# Patient Record
Sex: Female | Born: 1980 | Race: White | Hispanic: No | Marital: Single | Smoking: Current every day smoker
Health system: Southern US, Community
[De-identification: ages and names within clinical notes are randomized; demographics above are authoritative.]

## PROBLEM LIST (undated history)

## (undated) DIAGNOSIS — F4310 Post-traumatic stress disorder, unspecified: Secondary | ICD-10-CM

## (undated) DIAGNOSIS — R51 Headache: Secondary | ICD-10-CM

## (undated) DIAGNOSIS — N159 Renal tubulo-interstitial disease, unspecified: Secondary | ICD-10-CM

## (undated) DIAGNOSIS — F419 Anxiety disorder, unspecified: Secondary | ICD-10-CM

## (undated) DIAGNOSIS — F988 Other specified behavioral and emotional disorders with onset usually occurring in childhood and adolescence: Secondary | ICD-10-CM

## (undated) DIAGNOSIS — D649 Anemia, unspecified: Secondary | ICD-10-CM

## (undated) DIAGNOSIS — F329 Major depressive disorder, single episode, unspecified: Secondary | ICD-10-CM

## (undated) DIAGNOSIS — M797 Fibromyalgia: Secondary | ICD-10-CM

## (undated) DIAGNOSIS — R519 Headache, unspecified: Secondary | ICD-10-CM

## (undated) DIAGNOSIS — F319 Bipolar disorder, unspecified: Secondary | ICD-10-CM

## (undated) DIAGNOSIS — I499 Cardiac arrhythmia, unspecified: Secondary | ICD-10-CM

## (undated) DIAGNOSIS — N390 Urinary tract infection, site not specified: Secondary | ICD-10-CM

## (undated) DIAGNOSIS — IMO0001 Reserved for inherently not codable concepts without codable children: Secondary | ICD-10-CM

## (undated) DIAGNOSIS — I10 Essential (primary) hypertension: Secondary | ICD-10-CM

## (undated) DIAGNOSIS — Z8489 Family history of other specified conditions: Secondary | ICD-10-CM

## (undated) DIAGNOSIS — F32A Depression, unspecified: Secondary | ICD-10-CM

## (undated) HISTORY — PX: BREAST SURGERY: SHX581

## (undated) HISTORY — PX: WISDOM TOOTH EXTRACTION: SHX21

## (undated) HISTORY — PX: URETHRA SURGERY: SHX824

---

## 2012-12-11 ENCOUNTER — Encounter (HOSPITAL_BASED_OUTPATIENT_CLINIC_OR_DEPARTMENT_OTHER): Payer: Self-pay

## 2012-12-11 ENCOUNTER — Emergency Department (HOSPITAL_BASED_OUTPATIENT_CLINIC_OR_DEPARTMENT_OTHER)
Admission: EM | Admit: 2012-12-11 | Discharge: 2012-12-11 | Disposition: A | Discharge status: Home / Self Care | Payer: Self-pay | Attending: Emergency Medicine | Admitting: Emergency Medicine

## 2012-12-11 ENCOUNTER — Emergency Department (HOSPITAL_BASED_OUTPATIENT_CLINIC_OR_DEPARTMENT_OTHER): Payer: Self-pay

## 2012-12-11 DIAGNOSIS — IMO0002 Reserved for concepts with insufficient information to code with codable children: Secondary | ICD-10-CM | POA: Insufficient documentation

## 2012-12-11 DIAGNOSIS — Y929 Unspecified place or not applicable: Secondary | ICD-10-CM | POA: Insufficient documentation

## 2012-12-11 DIAGNOSIS — Z87448 Personal history of other diseases of urinary system: Secondary | ICD-10-CM | POA: Insufficient documentation

## 2012-12-11 DIAGNOSIS — S46909A Unspecified injury of unspecified muscle, fascia and tendon at shoulder and upper arm level, unspecified arm, initial encounter: Secondary | ICD-10-CM | POA: Insufficient documentation

## 2012-12-11 DIAGNOSIS — M79603 Pain in arm, unspecified: Secondary | ICD-10-CM

## 2012-12-11 DIAGNOSIS — S4980XA Other specified injuries of shoulder and upper arm, unspecified arm, initial encounter: Secondary | ICD-10-CM | POA: Insufficient documentation

## 2012-12-11 DIAGNOSIS — F988 Other specified behavioral and emotional disorders with onset usually occurring in childhood and adolescence: Secondary | ICD-10-CM | POA: Insufficient documentation

## 2012-12-11 DIAGNOSIS — Z87440 Personal history of urinary (tract) infections: Secondary | ICD-10-CM | POA: Insufficient documentation

## 2012-12-11 DIAGNOSIS — M549 Dorsalgia, unspecified: Secondary | ICD-10-CM

## 2012-12-11 DIAGNOSIS — Z79899 Other long term (current) drug therapy: Secondary | ICD-10-CM | POA: Insufficient documentation

## 2012-12-11 DIAGNOSIS — Z3202 Encounter for pregnancy test, result negative: Secondary | ICD-10-CM | POA: Insufficient documentation

## 2012-12-11 DIAGNOSIS — F17200 Nicotine dependence, unspecified, uncomplicated: Secondary | ICD-10-CM | POA: Insufficient documentation

## 2012-12-11 DIAGNOSIS — R296 Repeated falls: Secondary | ICD-10-CM | POA: Insufficient documentation

## 2012-12-11 DIAGNOSIS — Y939 Activity, unspecified: Secondary | ICD-10-CM | POA: Insufficient documentation

## 2012-12-11 HISTORY — DX: Other specified behavioral and emotional disorders with onset usually occurring in childhood and adolescence: F98.8

## 2012-12-11 HISTORY — DX: Urinary tract infection, site not specified: N39.0

## 2012-12-11 HISTORY — DX: Renal tubulo-interstitial disease, unspecified: N15.9

## 2012-12-11 LAB — URINALYSIS, ROUTINE W REFLEX MICROSCOPIC
Bilirubin Urine: NEGATIVE
Ketones, ur: NEGATIVE mg/dL
Leukocytes, UA: NEGATIVE
Nitrite: NEGATIVE
Protein, ur: NEGATIVE mg/dL
Urobilinogen, UA: 1 mg/dL (ref 0.0–1.0)
pH: 6 (ref 5.0–8.0)

## 2012-12-11 MED ORDER — OXYCODONE-ACETAMINOPHEN 5-325 MG PO TABS
1.0000 | ORAL_TABLET | Freq: Once | ORAL | Status: AC
  Administered 2012-12-11: 1 via ORAL
  Filled 2012-12-11 (×2): qty 1

## 2012-12-11 MED ORDER — OXYCODONE-ACETAMINOPHEN 5-325 MG PO TABS: 1.0000 | ORAL_TABLET | ORAL | Status: DC | PRN

## 2012-12-11 MED ORDER — CYCLOBENZAPRINE HCL 5 MG PO TABS: 5.0000 mg | ORAL_TABLET | Freq: Two times a day (BID) | ORAL | Status: DC | PRN

## 2012-12-11 MED ORDER — CYCLOBENZAPRINE HCL 10 MG PO TABS
5.0000 mg | ORAL_TABLET | Freq: Once | ORAL | Status: AC
  Administered 2012-12-11: 5 mg via ORAL
  Filled 2012-12-11: qty 1

## 2012-12-11 NOTE — ED Provider Notes (Signed)
Medical screening examination/treatment/procedure(s) were performed by non-physician practitioner and as supervising physician I was immediately available for consultation/collaboration.   Dione Booze, MD 12/11/12 [phone]

## 2012-12-11 NOTE — ED Notes (Signed)
Patient transported to X-ray 

## 2012-12-11 NOTE — ED Notes (Signed)
C/o lower back pain x 2 weeks- pain to right arm x 1 week

## 2012-12-11 NOTE — ED Provider Notes (Signed)
History     CSN: [id number]  Arrival date & time 12/11/12  1309   First MD Initiated Contact with Patient 12/11/12 1327      Chief Complaint  Patient presents with  . Back Pain    (Consider location/radiation/quality/duration/timing/severity/associated sxs/prior treatment) HPI Comments: Pt states that the pain started 2 weeks ago and then the pain worsened after she fell in the snow a week ago:pt states that 1 weeks ago she also developed shooting pains in her right hand:pt states that she is not having numbness, but she feels like she can't grip things equally  Patient is a 32 y.o. female presenting with back pain. The history is provided by the patient. No language interpreter was used.  Back Pain Location:  Lumbar spine Quality:  Aching Radiates to:  Does not radiate Pain severity:  Severe Pain is:  Same all the time Onset quality:  Gradual Duration:  2 weeks Timing:  Constant Progression:  Worsening Context: falling   Relieved by:  Nothing Worsened by:  Bending Ineffective treatments:  NSAIDs Associated symptoms: no numbness, no tingling and no weakness     Past Medical History  Diagnosis Date  . Kidney infection   . UTI (lower urinary tract infection)   . ADD (attention deficit disorder)     Past Surgical History  Procedure Laterality Date  . Urethra surgery      No family history on file.  History  Substance Use Topics  . Smoking status: Current Every Day Smoker  . Smokeless tobacco: Not on file  . Alcohol Use: Yes    OB History   Grav Para Term Preterm Abortions TAB SAB Ect Mult Living                  Review of Systems  Constitutional: Negative.   Respiratory: Negative.   Cardiovascular: Negative.   Musculoskeletal: Positive for back pain.  Neurological: Negative for tingling, weakness and numbness.    Allergies  Review of patient's allergies indicates not on file.  Home Medications   Current Outpatient Rx  Name  Route  Sig  Dispense   Refill  . Amphetamine-Dextroamphetamine (ADDERALL PO)   Oral   Take by mouth.         . ClonazePAM (KLONOPIN PO)   Oral   Take by mouth.         . CLONIDINE HCL PO   Oral   Take by mouth.         . QUEtiapine Fumarate (SEROQUEL PO)   Oral   Take by mouth.           BP 125/98  Pulse 107  Temp(Src) 98.8 F (37.1 C) (Oral)  Resp 22  Ht 5\' 3"  (1.6 m)  Wt 125 lb (56.7 kg)  BMI 22.15 kg/m2  Physical Exam  Nursing note and vitals reviewed. Constitutional: She is oriented to person, place, and time. She appears well-developed and well-nourished.  HENT:  Head: Normocephalic and atraumatic.  Eyes: Conjunctivae and EOM are normal.  Cardiovascular: Normal rate and regular rhythm.   Pulmonary/Chest: Effort normal and breath sounds normal.  Musculoskeletal: Normal range of motion.       Cervical back: She exhibits pain.       Thoracic back: She exhibits no tenderness.       Lumbar back: She exhibits tenderness and bony tenderness.  Neurological: She is alert and oriented to person, place, and time. Coordination normal.  Skin: Skin is warm.  Psychiatric: She has  a normal mood and affect.    ED Course  Procedures (including critical care time)  Labs Reviewed  URINALYSIS, ROUTINE W REFLEX MICROSCOPIC  PREGNANCY, URINE   Dg Cervical Spine Complete  12/11/2012  *RADIOLOGY REPORT*  Clinical Data: Back pain, right arm numbness.  No injury.  CERVICAL SPINE - COMPLETE 4+ VIEW  Comparison: None.  Findings: Cervical straightening.  Disc spaces are maintained. Prevertebral soft tissues are normal.  No fracture.  No neural foraminal narrowing.  No subluxation.  IMPRESSION: Cervical straightening which may be positional or related to muscle spasm.  No bony abnormality.  No neural foraminal narrowing.   Original Report Authenticated By: Charlett Nose, M.D.    Dg Lumbar Spine Complete  12/11/2012  *RADIOLOGY REPORT*  Clinical Data: Low back pain for 1 week without trauma  LUMBAR  SPINE - COMPLETE 4+ VIEW  Comparison: None.  Findings: Five lumbar type vertebral bodies.  Sacroiliac joints are symmetric.  Moderate ascending colonic stool. Maintenance of vertebral body height and alignment.  Intervertebral disc heights are maintained.  IMPRESSION: No acute osseous abnormality.  Possible constipation.   Original Report Authenticated By: Jeronimo Greaves, M.D.      1. Back pain   2. Arm pain   3. Muscle spasm       MDM  Pt not having any neuro deficits:will give pain medicine and muscle relaxer:will have follow up with Dr. Pearletha Forge for continued symptoms        Teressa Lower, NP 12/11/12 1511

## 2013-10-31 ENCOUNTER — Encounter (HOSPITAL_BASED_OUTPATIENT_CLINIC_OR_DEPARTMENT_OTHER): Payer: Self-pay | Admitting: Emergency Medicine

## 2013-10-31 ENCOUNTER — Inpatient Hospital Stay (HOSPITAL_BASED_OUTPATIENT_CLINIC_OR_DEPARTMENT_OTHER)
Admission: EM | Admit: 2013-10-31 | Discharge: 2013-11-01 | DRG: 918 | Disposition: A | Discharge status: Other Acute Inpatient Hospital | Payer: BC Managed Care – PPO | Attending: Internal Medicine | Admitting: Internal Medicine

## 2013-10-31 DIAGNOSIS — T43502A Poisoning by unspecified antipsychotics and neuroleptics, intentional self-harm, initial encounter: Secondary | ICD-10-CM

## 2013-10-31 DIAGNOSIS — F988 Other specified behavioral and emotional disorders with onset usually occurring in childhood and adolescence: Secondary | ICD-10-CM | POA: Diagnosis present

## 2013-10-31 DIAGNOSIS — E876 Hypokalemia: Secondary | ICD-10-CM | POA: Diagnosis present

## 2013-10-31 DIAGNOSIS — T43011A Poisoning by tricyclic antidepressants, accidental (unintentional), initial encounter: Secondary | ICD-10-CM

## 2013-10-31 DIAGNOSIS — E871 Hypo-osmolality and hyponatremia: Secondary | ICD-10-CM | POA: Diagnosis present

## 2013-10-31 DIAGNOSIS — T43501A Poisoning by unspecified antipsychotics and neuroleptics, accidental (unintentional), initial encounter: Principal | ICD-10-CM

## 2013-10-31 DIAGNOSIS — T50904A Poisoning by unspecified drugs, medicaments and biological substances, undetermined, initial encounter: Secondary | ICD-10-CM

## 2013-10-31 DIAGNOSIS — T50902A Poisoning by unspecified drugs, medicaments and biological substances, intentional self-harm, initial encounter: Secondary | ICD-10-CM

## 2013-10-31 DIAGNOSIS — F339 Major depressive disorder, recurrent, unspecified: Secondary | ICD-10-CM

## 2013-10-31 DIAGNOSIS — T50901A Poisoning by unspecified drugs, medicaments and biological substances, accidental (unintentional), initial encounter: Secondary | ICD-10-CM

## 2013-10-31 DIAGNOSIS — R9431 Abnormal electrocardiogram [ECG] [EKG]: Secondary | ICD-10-CM

## 2013-10-31 DIAGNOSIS — X838XXA Intentional self-harm by other specified means, initial encounter: Secondary | ICD-10-CM

## 2013-10-31 DIAGNOSIS — T50992A Poisoning by other drugs, medicaments and biological substances, intentional self-harm, initial encounter: Secondary | ICD-10-CM

## 2013-10-31 DIAGNOSIS — F17200 Nicotine dependence, unspecified, uncomplicated: Secondary | ICD-10-CM

## 2013-10-31 DIAGNOSIS — T43694A Poisoning by other psychostimulants, undetermined, initial encounter: Secondary | ICD-10-CM | POA: Diagnosis present

## 2013-10-31 DIAGNOSIS — F332 Major depressive disorder, recurrent severe without psychotic features: Secondary | ICD-10-CM

## 2013-10-31 DIAGNOSIS — T426X1A Poisoning by other antiepileptic and sedative-hypnotic drugs, accidental (unintentional), initial encounter: Secondary | ICD-10-CM

## 2013-10-31 DIAGNOSIS — Z720 Tobacco use: Secondary | ICD-10-CM

## 2013-10-31 DIAGNOSIS — F329 Major depressive disorder, single episode, unspecified: Secondary | ICD-10-CM

## 2013-10-31 DIAGNOSIS — T438X2A Poisoning by other psychotropic drugs, intentional self-harm, initial encounter: Secondary | ICD-10-CM

## 2013-10-31 DIAGNOSIS — T1491XA Suicide attempt, initial encounter: Secondary | ICD-10-CM

## 2013-10-31 LAB — COMPREHENSIVE METABOLIC PANEL
ALK PHOS: 74 U/L (ref 39–117)
ALT: 14 U/L (ref 0–35)
AST: 16 U/L (ref 0–37)
Albumin: 4.2 g/dL (ref 3.5–5.2)
BUN: 10 mg/dL (ref 6–23)
CHLORIDE: 101 meq/L (ref 96–112)
CO2: 22 meq/L (ref 19–32)
CREATININE: 0.7 mg/dL (ref 0.50–1.10)
Calcium: 9.5 mg/dL (ref 8.4–10.5)
GFR calc Af Amer: >90 mL/min (ref >90)
Glucose, Bld: 102 mg/dL — ABNORMAL HIGH (ref 70–99)
Potassium: 3 mEq/L — ABNORMAL LOW (ref 3.7–5.3)
Sodium: 143 mEq/L (ref 137–147)
Total Protein: 7.7 g/dL (ref 6.0–8.3)

## 2013-10-31 LAB — POCT I-STAT 3, VENOUS BLOOD GAS (G3P V)
Acid-base deficit: 6 mmol/L — ABNORMAL HIGH (ref 0.0–2.0)
Bicarbonate: 19 mEq/L — ABNORMAL LOW (ref 20.0–24.0)
O2 Saturation: 65 %
PCO2 VEN: 35.3 mmHg — AB (ref 45.0–50.0)
TCO2: 20 mmol/L (ref 0–100)
pH, Ven: 7.335 — ABNORMAL HIGH (ref 7.250–7.300)
pO2, Ven: 34 mmHg (ref 30.0–45.0)

## 2013-10-31 LAB — RAPID URINE DRUG SCREEN, HOSP PERFORMED
AMPHETAMINES: NOT DETECTED
BARBITURATES: NOT DETECTED
BENZODIAZEPINES: NOT DETECTED
COCAINE: NOT DETECTED
Opiates: NOT DETECTED
TETRAHYDROCANNABINOL: NOT DETECTED

## 2013-10-31 LAB — CBC WITH DIFFERENTIAL/PLATELET
BASOS ABS: 0 10*3/uL (ref 0.0–0.1)
Basophils Relative: 0 % (ref 0–1)
Eosinophils Absolute: 0.2 10*3/uL (ref 0.0–0.7)
Eosinophils Relative: 3 % (ref 0–5)
HEMATOCRIT: 39 % (ref 36.0–46.0)
HEMOGLOBIN: 13.3 g/dL (ref 12.0–15.0)
LYMPHS PCT: 43 % (ref 12–46)
Lymphs Abs: 3.3 10*3/uL (ref 0.7–4.0)
MCH: 30.5 pg (ref 26.0–34.0)
MCHC: 34.1 g/dL (ref 30.0–36.0)
MCV: 89.4 fL (ref 78.0–100.0)
MONO ABS: 0.4 10*3/uL (ref 0.1–1.0)
MONOS PCT: 5 % (ref 3–12)
NEUTROS ABS: 3.8 10*3/uL (ref 1.7–7.7)
Neutrophils Relative %: 49 % (ref 43–77)
Platelets: 348 10*3/uL (ref 150–400)
RBC: 4.36 MIL/uL (ref 3.87–5.11)
RDW: 12.8 % (ref 11.5–15.5)
WBC: 7.7 10*3/uL (ref 4.0–10.5)

## 2013-10-31 LAB — BASIC METABOLIC PANEL
BUN: 7 mg/dL (ref 6–23)
BUN: 7 mg/dL (ref 6–23)
CHLORIDE: 115 meq/L — AB (ref 96–112)
CO2: 17 mEq/L — ABNORMAL LOW (ref 19–32)
CO2: 18 meq/L — AB (ref 19–32)
Calcium: 8.4 mg/dL (ref 8.4–10.5)
Calcium: 9.1 mg/dL (ref 8.4–10.5)
Chloride: 109 mEq/L (ref 96–112)
Creatinine, Ser: 0.62 mg/dL (ref 0.50–1.10)
Creatinine, Ser: 0.65 mg/dL (ref 0.50–1.10)
GFR calc non Af Amer: >90 mL/min (ref >90)
GFR calc non Af Amer: >90 mL/min (ref >90)
Glucose, Bld: 90 mg/dL (ref 70–99)
Glucose, Bld: 95 mg/dL (ref 70–99)
POTASSIUM: 4 meq/L (ref 3.7–5.3)
Potassium: 4 mEq/L (ref 3.7–5.3)
Sodium: 143 mEq/L (ref 137–147)
Sodium: 139 mEq/L (ref 137–147)

## 2013-10-31 LAB — CBC
HEMATOCRIT: 33.8 % — AB (ref 36.0–46.0)
Hemoglobin: 11.5 g/dL — ABNORMAL LOW (ref 12.0–15.0)
MCH: 30.6 pg (ref 26.0–34.0)
MCHC: 34 g/dL (ref 30.0–36.0)
MCV: 89.9 fL (ref 78.0–100.0)
Platelets: 290 10*3/uL (ref 150–400)
RBC: 3.76 MIL/uL — ABNORMAL LOW (ref 3.87–5.11)
RDW: 13.3 % (ref 11.5–15.5)
WBC: 7.1 10*3/uL (ref 4.0–10.5)

## 2013-10-31 LAB — PREGNANCY, URINE: Preg Test, Ur: NEGATIVE

## 2013-10-31 LAB — ACETAMINOPHEN LEVEL: Acetaminophen (Tylenol), Serum: <15 ug/mL (ref 10–30)

## 2013-10-31 LAB — TROPONIN I
Troponin I: <0.3 ng/mL (ref <0.30)
Troponin I: <0.3 ng/mL (ref <0.30)
Troponin I: <0.3 ng/mL (ref <0.30)

## 2013-10-31 LAB — CREATININE, SERUM: Creatinine, Ser: 0.63 mg/dL (ref 0.50–1.10)

## 2013-10-31 LAB — CG4 I-STAT (LACTIC ACID): Lactic Acid, Venous: 3.29 mmol/L — ABNORMAL HIGH (ref 0.5–2.2)

## 2013-10-31 LAB — ETHANOL: Alcohol, Ethyl (B): 76 mg/dL — ABNORMAL HIGH (ref 0–11)

## 2013-10-31 LAB — LACTIC ACID, PLASMA: LACTIC ACID, VENOUS: 1.2 mmol/L (ref 0.5–2.2)

## 2013-10-31 LAB — MAGNESIUM: Magnesium: 2 mg/dL (ref 1.5–2.5)

## 2013-10-31 LAB — MRSA PCR SCREENING: MRSA by PCR: NEGATIVE

## 2013-10-31 LAB — SALICYLATE LEVEL: Salicylate Lvl: 2.8 mg/dL (ref 2.8–20.0)

## 2013-10-31 LAB — GLUCOSE, CAPILLARY: Glucose-Capillary: 94 mg/dL (ref 70–99)

## 2013-10-31 MED ORDER — POTASSIUM CHLORIDE 10 MEQ/100ML IV SOLN
10.0000 meq | Freq: Once | INTRAVENOUS | Status: AC
  Administered 2013-10-31: 10 meq via INTRAVENOUS
  Filled 2013-10-31: qty 100

## 2013-10-31 MED ORDER — HEPARIN SODIUM (PORCINE) 5000 UNIT/ML IJ SOLN
5000.0000 [IU] | Freq: Three times a day (TID) | INTRAMUSCULAR | Status: DC
  Administered 2013-10-31 (×2): 5000 [IU] via SUBCUTANEOUS
  Filled 2013-10-31 (×7): qty 1

## 2013-10-31 MED ORDER — POTASSIUM CHLORIDE 10 MEQ/100ML IV SOLN
10.0000 meq | INTRAVENOUS | Status: AC
  Administered 2013-10-31 (×3): 10 meq via INTRAVENOUS
  Filled 2013-10-31 (×3): qty 100

## 2013-10-31 MED ORDER — NICOTINE 21 MG/24HR TD PT24
21.0000 mg | MEDICATED_PATCH | Freq: Every day | TRANSDERMAL | Status: DC
  Administered 2013-10-31 – 2013-11-01 (×2): 21 mg via TRANSDERMAL
  Filled 2013-10-31 (×2): qty 1

## 2013-10-31 MED ORDER — SODIUM CHLORIDE 0.9 % IV BOLUS (SEPSIS)
1000.0000 mL | Freq: Once | INTRAVENOUS | Status: AC
  Administered 2013-10-31: 1000 mL via INTRAVENOUS

## 2013-10-31 MED ORDER — NALOXONE HCL 1 MG/ML IJ SOLN
2.0000 mg | INTRAMUSCULAR | Status: DC | PRN
  Administered 2013-10-31: 2 mg via INTRAVENOUS
  Filled 2013-10-31 (×2): qty 2

## 2013-10-31 NOTE — ED Notes (Addendum)
Pt arrived by private vehicle by her mother. States her daughter has possibly over dosed on her medications. Pt is drowsy but will respond when asked questions.  Pt states she took 6 300mg  seroquel. Pt states she drank a bottle of wine. Pt. Will not answer  when asked if she was trying to hurt herself. Family states she has tried to "hurt herself" in the past by overdosing on pills. MD aware of pt on arrival

## 2013-10-31 NOTE — Consult Note (Signed)
[facility] Consult   Reason for Consult:  Overdose on her psych medication. Referring Physician:  Dr Volanda Napoleon   Heather Preston is an 33 y.o. female.  Assessment: AXIS I:  Major Depression, Recurrent severe AXIS II:  Deferred AXIS III:   Past Medical History  Diagnosis Date  . Kidney infection   . UTI (lower urinary tract infection)   . ADD (attention deficit disorder)    AXIS IV:  other psychosocial or environmental problems and problems related to social environment AXIS V:  11-20 some danger of hurting self or others possible OR occasionally fails to maintain minimal personal hygiene OR gross impairment in communication  Plan:  Recommend psychiatric Inpatient admission when medically cleared.  Subjective:   Heather Preston is a 33 y.o. female patient admitted with overdose.  HPI:  Patient seen chart reviewed.  This is a 33 year old Caucasian single unemployed female who was admitted on the medical floor after taking multiple medication overdose.  She took Topamax, Trileptal, Seroquel, doxepin and Brittnilex.  Patient endorsed that she has been feeling more depressed and lately having difficulty falling asleep.  Her best friend died on Christmas certainly be at patient is unaware what happened because his autopsy waiting period she also endorsed recently she lost her job from [place].  Patient endorsed that she's been feeling sad depressed feeling hopeless and helpless and sometimes feels does not want to live anymore.  She denies any hallucination or any paranoia but endorsed long history of psychiatric illness with multiple hospitalization.  Her last psychiatric hospitalization was at [facility].  She completed program for 3 months.  She is also seeing a therapist for DBT.  She denies any hallucination or paranoia but admitted feeling of hopelessness, anhedonia and suicidal thoughts. HPI Elements:   Location:  Medical floor. Quality:  poor. Severity:   Moderate.  Past Psychiatric History: Past Medical History  Diagnosis Date  . Kidney infection   . UTI (lower urinary tract infection)   . ADD (attention deficit disorder)     reports that she has been smoking.  She does not have any smokeless tobacco history on file. She reports that she drinks alcohol. She reports that she does not use illicit drugs. History reviewed. No pertinent family history.         Allergies:  No Known Allergies  ACT Assessment Complete:  No:   Past Psychiatric History: Diagnosis:  Maj. depressive disorder   Hospitalizations:  Yes   Outpatient Care:  Yes   Substance Abuse Care:  No   Self-Mutilation:  Yes   Suicidal Attempts:  Yes   Homicidal Behaviors:  Denies    Violent Behaviors:  Denies    Place of Residence:  Lives by herself Marital Status:  Single Employed/Unemployed:  Unemployed Family Supports:  Yes, sister Objective: Blood pressure 114/83, pulse 92, temperature 98.3 F (36.8 C), temperature source Oral, resp. rate 20, height 5' 4"  (1.626 m), weight 125 lb (56.7 kg), SpO2 100.00%.Body mass index is 21.45 kg/(m^2). Results for orders placed during the hospital encounter of 10/31/13 (from the past 72 hour(s))  GLUCOSE, CAPILLARY     Status: None   Collection Time    10/31/13  2:44 AM      Result Value Range   Glucose-Capillary 94  70 - 99 mg/dL  CBC WITH DIFFERENTIAL     Status: None   Collection Time    10/31/13  2:50 AM      Result Value Range  WBC 7.7  4.0 - 10.5 K/uL   RBC 4.36  3.87 - 5.11 MIL/uL   Hemoglobin 13.3  12.0 - 15.0 g/dL   HCT 39.0  36.0 - 46.0 %   MCV 89.4  78.0 - 100.0 fL   MCH 30.5  26.0 - 34.0 pg   MCHC 34.1  30.0 - 36.0 g/dL   RDW 12.8  11.5 - 15.5 %   Platelets 348  150 - 400 K/uL   Neutrophils Relative % 49  43 - 77 %   Neutro Abs 3.8  1.7 - 7.7 K/uL   Lymphocytes Relative 43  12 - 46 %   Lymphs Abs 3.3  0.7 - 4.0 K/uL   Monocytes Relative 5  3 - 12 %   Monocytes Absolute 0.4  0.1 - 1.0 K/uL    Eosinophils Relative 3  0 - 5 %   Eosinophils Absolute 0.2  0.0 - 0.7 K/uL   Basophils Relative 0  0 - 1 %   Basophils Absolute 0.0  0.0 - 0.1 K/uL  COMPREHENSIVE METABOLIC PANEL     Status: Abnormal   Collection Time    10/31/13  2:50 AM      Result Value Range   Sodium 143  137 - 147 mEq/L   Potassium 3.0 (*) 3.7 - 5.3 mEq/L   Chloride 101  96 - 112 mEq/L   CO2 22  19 - 32 mEq/L   Glucose, Bld 102 (*) 70 - 99 mg/dL   BUN 10  6 - 23 mg/dL   Creatinine, Ser 0.70  0.50 - 1.10 mg/dL   Calcium 9.5  8.4 - 10.5 mg/dL   Total Protein 7.7  6.0 - 8.3 g/dL   Albumin 4.2  3.5 - 5.2 g/dL   AST 16  0 - 37 U/L   ALT 14  0 - 35 U/L   Alkaline Phosphatase 74  39 - 117 U/L   Total Bilirubin <0.2 (*) 0.3 - 1.2 mg/dL   GFR calc non Af Amer >90  >90 mL/min   GFR calc Af Amer >90  >90 mL/min   Comment: (NOTE)     The eGFR has been calculated using the CKD EPI equation.     This calculation has not been validated in all clinical situations.     eGFR's persistently <90 mL/min signify possible Chronic Kidney     Disease.  ACETAMINOPHEN LEVEL     Status: None   Collection Time    10/31/13  2:50 AM      Result Value Range   Acetaminophen (Tylenol), Serum <15.0  10 - 30 ug/mL   Comment:            THERAPEUTIC CONCENTRATIONS VARY     SIGNIFICANTLY. A RANGE OF 10-30     ug/mL MAY BE AN EFFECTIVE     CONCENTRATION FOR MANY PATIENTS.     HOWEVER, SOME ARE BEST TREATED     AT CONCENTRATIONS OUTSIDE THIS     RANGE.     ACETAMINOPHEN CONCENTRATIONS     >150 ug/mL AT 4 HOURS AFTER     INGESTION AND >50 ug/mL AT 12     HOURS AFTER INGESTION ARE     OFTEN ASSOCIATED WITH TOXIC     REACTIONS.  SALICYLATE LEVEL     Status: None   Collection Time    10/31/13  2:50 AM      Result Value Range   Salicylate Lvl 2.8  2.8 - 20.0 mg/dL  ETHANOL     Status: Abnormal   Collection Time    10/31/13  2:50 AM      Result Value Range   Alcohol, Ethyl (B) 76 (*) 0 - 11 mg/dL   Comment:            LOWEST  DETECTABLE LIMIT FOR     SERUM ALCOHOL IS 11 mg/dL     FOR MEDICAL PURPOSES ONLY  MAGNESIUM     Status: None   Collection Time    10/31/13  2:50 AM      Result Value Range   Magnesium 2.0  1.5 - 2.5 mg/dL  TROPONIN I     Status: None   Collection Time    10/31/13  2:50 AM      Result Value Range   Troponin I <0.30  <0.30 ng/mL   Comment:            Due to the release kinetics of cTnI,     a negative result within the first hours     of the onset of symptoms does not rule out     myocardial infarction with certainty.     If myocardial infarction is still suspected,     repeat the test at appropriate intervals.  PREGNANCY, URINE     Status: None   Collection Time    10/31/13  3:15 AM      Result Value Range   Preg Test, Ur NEGATIVE  NEGATIVE   Comment:            THE SENSITIVITY OF THIS     METHODOLOGY IS >20 mIU/mL.  URINE RAPID DRUG SCREEN (HOSP PERFORMED)     Status: None   Collection Time    10/31/13  3:15 AM      Result Value Range   Opiates NONE DETECTED  NONE DETECTED   Cocaine NONE DETECTED  NONE DETECTED   Benzodiazepines NONE DETECTED  NONE DETECTED   Amphetamines NONE DETECTED  NONE DETECTED   Tetrahydrocannabinol NONE DETECTED  NONE DETECTED   Barbiturates NONE DETECTED  NONE DETECTED   Comment:            DRUG SCREEN FOR MEDICAL PURPOSES     ONLY.  IF CONFIRMATION IS NEEDED     FOR ANY PURPOSE, NOTIFY LAB     WITHIN 5 DAYS.                LOWEST DETECTABLE LIMITS     FOR URINE DRUG SCREEN     Drug Class       Cutoff (ng/mL)     Amphetamine      1000     Barbiturate      200     Benzodiazepine   568     Tricyclics       127     Opiates          300     Cocaine          300     THC              50  CG4 I-STAT (LACTIC ACID)     Status: Abnormal   Collection Time    10/31/13  3:17 AM      Result Value Range   Lactic Acid, Venous 3.29 (*) 0.5 - 2.2 mmol/L  POCT I-STAT 3, BLOOD GAS (G3P V)     Status: Abnormal   Collection Time    10/31/13  3:37 AM       Result Value Range   pH, Ven 7.335 (*) 7.250 - 7.300   pCO2, Ven 35.3 (*) 45.0 - 50.0 mmHg   pO2, Ven 34.0  30.0 - 45.0 mmHg   Bicarbonate 19.0 (*) 20.0 - 24.0 mEq/L   TCO2 20  0 - 100 mmol/L   O2 Saturation 65.0     Acid-base deficit 6.0 (*) 0.0 - 2.0 mmol/L   Patient temperature 97.2 F     Collection site IV START     Drawn by RT     Sample type VENOUS     Comment NOTIFIED PHYSICIAN    MRSA PCR SCREENING     Status: None   Collection Time    10/31/13  7:03 AM      Result Value Range   MRSA by PCR NEGATIVE  NEGATIVE   Comment:            The GeneXpert MRSA Assay (FDA     approved for NASAL specimens     only), is one component of a     comprehensive MRSA colonization     surveillance program. It is not     intended to diagnose MRSA     infection nor to guide or     monitor treatment for     MRSA infections.  BASIC METABOLIC PANEL     Status: Abnormal   Collection Time    10/31/13 10:05 AM      Result Value Range   Sodium 143  137 - 147 mEq/L   Potassium 4.0  3.7 - 5.3 mEq/L   Chloride 115 (*) 96 - 112 mEq/L   CO2 17 (*) 19 - 32 mEq/L   Glucose, Bld 90  70 - 99 mg/dL   BUN 7  6 - 23 mg/dL   Creatinine, Ser 0.62  0.50 - 1.10 mg/dL   Calcium 8.4  8.4 - 10.5 mg/dL   GFR calc non Af Amer >90  >90 mL/min   GFR calc Af Amer >90  >90 mL/min   Comment: (NOTE)     The eGFR has been calculated using the CKD EPI equation.     This calculation has not been validated in all clinical situations.     eGFR's persistently <90 mL/min signify possible Chronic Kidney     Disease.  CBC     Status: Abnormal   Collection Time    10/31/13 10:05 AM      Result Value Range   WBC 7.1  4.0 - 10.5 K/uL   RBC 3.76 (*) 3.87 - 5.11 MIL/uL   Hemoglobin 11.5 (*) 12.0 - 15.0 g/dL   HCT 33.8 (*) 36.0 - 46.0 %   MCV 89.9  78.0 - 100.0 fL   MCH 30.6  26.0 - 34.0 pg   MCHC 34.0  30.0 - 36.0 g/dL   RDW 13.3  11.5 - 15.5 %   Platelets 290  150 - 400 K/uL  CREATININE, SERUM     Status: None    Collection Time    10/31/13 10:05 AM      Result Value Range   Creatinine, Ser 0.63  0.50 - 1.10 mg/dL   GFR calc non Af Amer >90  >90 mL/min   GFR calc Af Amer >90  >90 mL/min   Comment: (NOTE)     The eGFR has been calculated using the CKD EPI equation.     This calculation has not been validated in all  clinical situations.     eGFR's persistently <90 mL/min signify possible Chronic Kidney     Disease.  TROPONIN I     Status: None   Collection Time    10/31/13 12:19 PM      Result Value Range   Troponin I <0.30  <0.30 ng/mL   Comment:            Due to the release kinetics of cTnI,     a negative result within the first hours     of the onset of symptoms does not rule out     myocardial infarction with certainty.     If myocardial infarction is still suspected,     repeat the test at appropriate intervals.  LACTIC ACID, PLASMA     Status: None   Collection Time    10/31/13 12:19 PM      Result Value Range   Lactic Acid, Venous 1.2  0.5 - 2.2 mmol/L  ACETAMINOPHEN LEVEL     Status: None   Collection Time    10/31/13 12:19 PM      Result Value Range   Acetaminophen (Tylenol), Serum <15.0  10 - 30 ug/mL   Comment:            THERAPEUTIC CONCENTRATIONS VARY     SIGNIFICANTLY. A RANGE OF 10-30     ug/mL MAY BE AN EFFECTIVE     CONCENTRATION FOR MANY PATIENTS.     HOWEVER, SOME ARE BEST TREATED     AT CONCENTRATIONS OUTSIDE THIS     RANGE.     ACETAMINOPHEN CONCENTRATIONS     >150 ug/mL AT 4 HOURS AFTER     INGESTION AND >50 ug/mL AT 12     HOURS AFTER INGESTION ARE     OFTEN ASSOCIATED WITH TOXIC     REACTIONS.   Labs are reviewed.  Current Facility-Administered Medications  Medication Dose Route Frequency Provider Last Rate Last Dose  . heparin injection 5,000 Units  5,000 Units Subcutaneous Q8H Myrtis Ser, MD   5,000 Units at 10/31/13 0858  . naloxone [facility]) injection 2 mg  2 mg Intravenous PRN April K Palumbo-Rasch, MD   2 mg at 10/31/13 0249  . nicotine  (NICODERM CQ - dosed in mg/24 hours) patch 21 mg  21 mg Transdermal Daily Myrtis Ser, MD   21 mg at 10/31/13 1214    Psychiatric Specialty Exam:     Blood pressure 114/83, pulse 92, temperature 98.3 F (36.8 C), temperature source Oral, resp. rate 20, height 5' 4"  (1.626 m), weight 125 lb (56.7 kg), SpO2 100.00%.Body mass index is 21.45 kg/(m^2).  General Appearance: Guarded  Eye Contact::  Fair  Speech:  Slow  Volume:  Normal  Mood:  Anxious, Depressed and Hopeless  Affect:  Constricted and Depressed  Thought Process:  Linear  Orientation:  Full (Time, Place, and Person)  Thought Content:  Rumination  Suicidal Thoughts:  Yes.  with intent/plan  Homicidal Thoughts:  No  Memory:  Immediate;   Fair Recent;   Fair Remote;   Fair  Judgement:  Impaired  Insight:  Lacking  Psychomotor Activity:  Decreased  Concentration:  Fair  Recall:  Fair  Akathisia:  No  Handed:  Right  AIMS (if indicated):     Assets:  Housing  Sleep:      Treatment Plan Summary: Medication management Continue sitter for patient safety.  Patient requires inpatient psychiatric treatment for stabilization and treatment.  Once medically cleared transferred to  behavioral [place].  Please call [phone] if any further questions.  Evangeline Utley T. 10/31/2013 2:16 PM

## 2013-10-31 NOTE — ED Notes (Signed)
Spoke with Patty with [facility] = she states to monitor patient for respiratory depression, hypotension, tachycardia, electrolytes, magnesium levels, EKG abnormalities and that patient must be monitored 6 hours after arrival to ED for observation.

## 2013-10-31 NOTE — Progress Notes (Signed)
PENDING ACCEPTANCE TRANFER NOTE:  Call received from:   [facility]  REASON FOR REQUESTING TRANSFER:  Continue evaluation and Tx of polysubstance OD.  HPI:  Suicidal attempt with OD of alcohol, Seroquel, Topamax, Doxepin, and others.  More awake now. Oral airway OK. Not hypotensive nor tachycardic.  QTc a little less than 500.  ABG pH 7.33, Serum bicarb 22.  Hypokalemic being supplemented.     PLAN:  According to telephone report, this patient was accepted for transfer to SDU,   [facility]  10,  I have requested an order be written to call Flow Manager at [phone] upon patient arrival to the floor for final physician assignment who will do the admission and give admitting orders.  She will need psych consult and 1:1 sitter.  SIGNEHouston Siren: Cyndal Kasson, MD [facility]  10/31/2013, 5:14 AM

## 2013-10-31 NOTE — H&P (Signed)
[facility] History and Physical  Torrey Horseman RUE:[id number] DOB: 1980/12/05 DOA: 10/31/2013  Referring physician: EDP PCP: No primary provider on file.   Chief Complaint: overdose  HPI: Heather Preston is a 33 y.o. female with a history of major depression who presents with overdose.  She staes that she has been very depressed for several reasons- she has recently been laid off from her job and her best friend passed away recently. She states that she took about seroquel 6, trileptal 6-10, topamax 5-7, doxepin 5-6, and excedrin migraine as well as a bottle of wine in an attempt to "numb her pain".  She denies overt suicidal intention, buts says that she didn't really care whether she woke up again.  She then called her parents who brought her to the [facility] ED and then was transferred to [facility].  On presentation she was groggy but able to protect airway.  EKG with QT prolongation and tachycardia, hypokalemia at 3.0. UDS negative. ETOH positive. Acetaminophen and salicylate levels negative.   Review of Systems:  Constitutional: No weight loss, night sweats, Fevers, chills, fatigue.  HEENT: No headaches, Difficulty swallowing,Tooth/dental problems,Sore throat, No sneezing, itching, ear ache, nasal congestion, post nasal drip,  Cardio-vascular: No chest pain, Orthopnea, PND, swelling in lower extremities, anasarca, dizziness, palpitations  GI: No heartburn, indigestion, abdominal pain, nausea, vomiting, diarrhea, change in bowel habits, loss of appetite  Resp: No shortness of breath with exertion or at rest. No excess mucus, no productive cough, No non-productive cough, No coughing up of blood.No change in color of mucus.No wheezing.No chest wall deformity  Skin: no rash or lesions.  GU: no dysuria, change in color of urine, no urgency or frequency. No flank pain.  Musculoskeletal: No joint pain or swelling. No decreased range of motion. No back pain.  Psych: depression  Past Medical History   Diagnosis Date  . Kidney infection   . UTI (lower urinary tract infection)   . ADD (attention deficit disorder)    Past Surgical History  Procedure Laterality Date  . Urethra surgery     Social History:  reports that she has been smoking.  She does not have any smokeless tobacco history on file. She reports that she drinks alcohol. She reports that she does not use illicit drugs.  No Known Allergies  History reviewed. No pertinent family history.   Prior to Admission medications   Medication Sig Start Date End Date Taking? Authorizing Provider  Oxcarbazepine (TRILEPTAL) 300 MG tablet Take 300 mg by mouth daily.   Yes Historical Provider, MD  QUEtiapine Fumarate (SEROQUEL PO) Take 300 mg by mouth 2 (two) times daily.    Yes Historical Provider, MD  Amphetamine-Dextroamphetamine (ADDERALL PO) Take by mouth.    Historical Provider, MD  ClonazePAM (KLONOPIN PO) Take by mouth.    Historical Provider, MD  CLONIDINE HCL PO Take by mouth.    Historical Provider, MD  cyclobenzaprine (FLEXERIL) 5 MG tablet Take 1 tablet (5 mg total) by mouth 2 (two) times daily as needed for muscle spasms. 12/11/12   Teressa Lower, NP  oxyCODONE-acetaminophen (PERCOCET/ROXICET) 5-325 MG per tablet Take 1 tablet by mouth every 4 (four) hours as needed for pain. 12/11/12   Teressa Lower, NP   Physical Exam: Filed Vitals:   10/31/13 0454  BP:   Pulse: 98  Temp:   Resp:     BP 114/76  Pulse 98  Temp(Src) 98.1 F (36.7 C) (Oral)  Resp 13  Ht 5\' 4"  (1.626 m)  Wt 56.7 kg (125 lb)  BMI 21.45 kg/m2  SpO2 100%  General:  Appears calm and comfortable sitting up in bed Eyes: PERRL, normal lids, irises & conjunctiva ENT: grossly normal hearing, mmm dry Neck: no LAD, masses or thyromegaly Cardiovascular: tachy, regular, no m/r/g. No LE edema. Telemetry: sinus tach, no arrhythmias  Respiratory: CTA bilaterally, no w/r/r. Normal respiratory effort. Abdomen: soft, ntnd Skin: pink rash over the lower  extremities, otherwise no skin changes, no bruises or skin break Musculoskeletal: grossly normal tone BUE/BLE Psychiatric: speech fluent and appropriate, good eye contact, flat affect Neurologic: Aox4, CNI-XII intact, strength 5/5, non focal exam          Labs on Admission:  Basic Metabolic Panel:  Recent Labs Lab 10/31/13 0250  NA 143  K 3.0*  CL 101  CO2 22  GLUCOSE 102*  BUN 10  CREATININE 0.70  CALCIUM 9.5  MG 2.0   Liver Function Tests:  Recent Labs Lab 10/31/13 0250  AST 16  ALT 14  ALKPHOS 74  BILITOT <0.2*  PROT 7.7  ALBUMIN 4.2   No results found for this basename: LIPASE, AMYLASE,  in the last 168 hours No results found for this basename: AMMONIA,  in the last 168 hours CBC:  Recent Labs Lab 10/31/13 0250  WBC 7.7  NEUTROABS 3.8  HGB 13.3  HCT 39.0  MCV 89.4  PLT 348   Cardiac Enzymes:  Recent Labs Lab 10/31/13 0250  TROPONINI <0.30    BNP (last 3 results) No results found for this basename: PROBNP,  in the last 8760 hours CBG:  Recent Labs Lab 10/31/13 0244  GLUCAP 94    Radiological Exams on Admission: No results found.  EKG: NSR question of inverted/flat T's in V4-6   Assessment/Plan Active Problems:   Overdose   Depression, major   Tobacco abuse   Suicide attempt   Abnormal EKG   Intentional drug overdose   1. Overdose:   Monitor BMET q 4 h for 12 hours for hypokalemia, hyponatremia.    [place]or on tele for QT prolongation.   Monitor mental status for sedation. 2. Depression/suicide attempt  Psychiatry consult 3. Abnormal EKG  Monitor on tele  Cycle cardiac enzymes 4. Hypokalemia  replete 5. Tobacco abuse   Code Status: FULL Family Communication: spoke with patient Disposition Plan: observe in SDU for today  Time spent: 50 minutes  [facility] Behavioral HealthcareWALSH,CATHERINE [facility] Pager [phone]

## 2013-10-31 NOTE — ED Provider Notes (Signed)
CSN: [id number]     Arrival date & time 10/31/13  0224 History   First MD Initiated Contact with Patient 10/31/13 0236     Chief Complaint  Patient presents with  . ? overdose    (Consider location/radiation/quality/duration/timing/severity/associated sxs/prior Treatment) Patient is a 33 y.o. female presenting with Overdose. The history is provided by a relative. The history is limited by the condition of the patient (will not answer and is intermittently sleeping).  Drug Overdose This is a new problem. Episode onset: unknown. The problem occurs constantly. The problem has not changed since onset.Nothing aggravates the symptoms. Nothing relieves the symptoms. She has tried nothing for the symptoms. The treatment provided no relief.  Reportedly patient called family and stated she took pills and drank a bottle of wine.  Initially patient will not report ingestion.    Past Medical History  Diagnosis Date  . Kidney infection   . UTI (lower urinary tract infection)   . ADD (attention deficit disorder)    Past Surgical History  Procedure Laterality Date  . Urethra surgery     No family history on file. History  Substance Use Topics  . Smoking status: Current Every Day Smoker  . Smokeless tobacco: Not on file  . Alcohol Use: Yes   OB History   Grav Para Term Preterm Abortions TAB SAB Ect Mult Living                 Review of Systems  Unable to perform ROS   Allergies  Review of patient's allergies indicates no known allergies.  Home Medications   Current Outpatient Rx  Name  Route  Sig  Dispense  Refill  . Oxcarbazepine (TRILEPTAL) 300 MG tablet   Oral   Take 300 mg by mouth daily.         . Amphetamine-Dextroamphetamine (ADDERALL PO)   Oral   Take by mouth.         . ClonazePAM (KLONOPIN PO)   Oral   Take by mouth.         . CLONIDINE HCL PO   Oral   Take by mouth.         . cyclobenzaprine (FLEXERIL) 5 MG tablet   Oral   Take 1 tablet (5 mg total)  by mouth 2 (two) times daily as needed for muscle spasms.   20 tablet   0   . oxyCODONE-acetaminophen (PERCOCET/ROXICET) 5-325 MG per tablet   Oral   Take 1 tablet by mouth every 4 (four) hours as needed for pain.   15 tablet   0   . QUEtiapine Fumarate (SEROQUEL PO)   Oral   Take 300 mg by mouth 2 (two) times daily.           There were no vitals taken for this visit. Physical Exam  Constitutional: She appears well-developed and well-nourished.  HENT:  Head: Normocephalic and atraumatic. Head is without raccoon's eyes and without Battle's sign.  Right Ear: External ear normal. No hemotympanum.  Left Ear: External ear normal. No hemotympanum.  Eyes: Conjunctivae are normal.  Pinpoint pupils  Neck: Normal range of motion.  Cardiovascular: Normal rate, regular rhythm and intact distal pulses.   Pulmonary/Chest: Effort normal. No stridor. No respiratory distress. She has no wheezes. She has no rales.  Abdominal: Soft. Bowel sounds are normal. There is no tenderness. There is no rebound and no guarding.  Musculoskeletal: Normal range of motion. She exhibits no edema and no tenderness.  Lymphadenopathy:  She has no cervical adenopathy.  Neurological: She has normal reflexes.  Skin: Skin is warm and dry. She is not diaphoretic.  Psychiatric:  Unable to assess    ED Course  Procedures (including critical care time) Labs Review Labs Reviewed  COMPREHENSIVE METABOLIC PANEL - Abnormal; Notable for the following:    Potassium 3.0 (*)    Glucose, Bld 102 (*)    Total Bilirubin <0.2 (*)    All other components within normal limits  ETHANOL - Abnormal; Notable for the following:    Alcohol, Ethyl (B) 76 (*)    All other components within normal limits  CG4 I-STAT (LACTIC ACID) - Abnormal; Notable for the following:    Lactic Acid, Venous 3.29 (*)    All other components within normal limits  CBC WITH DIFFERENTIAL  PREGNANCY, URINE  GLUCOSE, CAPILLARY  ACETAMINOPHEN LEVEL   SALICYLATE LEVEL  URINE RAPID DRUG SCREEN (HOSP PERFORMED)   Imaging Review No results found.  EKG Interpretation   None       MDM  No diagnosis found. Will need serial labs and exams.  Maintain strict NPO. Patient later admitted to taking several seroquel in attempt to hurt herself.  When medically stable will need behavioral health admission for suicide attempt  Results for orders placed during the hospital encounter of 10/31/13  CBC WITH DIFFERENTIAL      Result Value Range   WBC 7.7  4.0 - 10.5 K/uL   RBC 4.36  3.87 - 5.11 MIL/uL   Hemoglobin 13.3  12.0 - 15.0 g/dL   HCT 16.1  09.6 - 04.5 %   MCV 89.4  78.0 - 100.0 fL   MCH 30.5  26.0 - 34.0 pg   MCHC 34.1  30.0 - 36.0 g/dL   RDW 40.9  81.1 - 91.4 %   Platelets 348  150 - 400 K/uL   Neutrophils Relative % 49  43 - 77 %   Neutro Abs 3.8  1.7 - 7.7 K/uL   Lymphocytes Relative 43  12 - 46 %   Lymphs Abs 3.3  0.7 - 4.0 K/uL   Monocytes Relative 5  3 - 12 %   Monocytes Absolute 0.4  0.1 - 1.0 K/uL   Eosinophils Relative 3  0 - 5 %   Eosinophils Absolute 0.2  0.0 - 0.7 K/uL   Basophils Relative 0  0 - 1 %   Basophils Absolute 0.0  0.0 - 0.1 K/uL  COMPREHENSIVE METABOLIC PANEL      Result Value Range   Sodium 143  137 - 147 mEq/L   Potassium 3.0 (*) 3.7 - 5.3 mEq/L   Chloride 101  96 - 112 mEq/L   CO2 22  19 - 32 mEq/L   Glucose, Bld 102 (*) 70 - 99 mg/dL   BUN 10  6 - 23 mg/dL   Creatinine, Ser 7.82  0.50 - 1.10 mg/dL   Calcium 9.5  8.4 - 95.6 mg/dL   Total Protein 7.7  6.0 - 8.3 g/dL   Albumin 4.2  3.5 - 5.2 g/dL   AST 16  0 - 37 U/L   ALT 14  0 - 35 U/L   Alkaline Phosphatase 74  39 - 117 U/L   Total Bilirubin <0.2 (*) 0.3 - 1.2 mg/dL   GFR calc non Af Amer >90  >90 mL/min   GFR calc Af Amer >90  >90 mL/min  PREGNANCY, URINE      Result Value Range   Preg  Test, Ur NEGATIVE  NEGATIVE  ACETAMINOPHEN LEVEL      Result Value Range   Acetaminophen (Tylenol), Serum <15.0  10 - 30 ug/mL  SALICYLATE LEVEL       Result Value Range   Salicylate Lvl 2.8  2.8 - 20.0 mg/dL  ETHANOL      Result Value Range   Alcohol, Ethyl (B) 76 (*) 0 - 11 mg/dL  URINE RAPID DRUG SCREEN (HOSP PERFORMED)      Result Value Range   Opiates NONE DETECTED  NONE DETECTED   Cocaine NONE DETECTED  NONE DETECTED   Benzodiazepines NONE DETECTED  NONE DETECTED   Amphetamines NONE DETECTED  NONE DETECTED   Tetrahydrocannabinol NONE DETECTED  NONE DETECTED   Barbiturates NONE DETECTED  NONE DETECTED  GLUCOSE, CAPILLARY      Result Value Range   Glucose-Capillary 94  70 - 99 mg/dL  MAGNESIUM      Result Value Range   Magnesium 2.0  1.5 - 2.5 mg/dL  CG4 I-STAT (LACTIC ACID)      Result Value Range   Lactic Acid, Venous 3.29 (*) 0.5 - 2.2 mmol/L  POCT I-STAT 3, BLOOD GAS (G3P V)      Result Value Range   pH, Ven 7.335 (*) 7.250 - 7.300   pCO2, Ven 35.3 (*) 45.0 - 50.0 mmHg   pO2, Ven 34.0  30.0 - 45.0 mmHg   Bicarbonate 19.0 (*) 20.0 - 24.0 mEq/L   TCO2 20  0 - 100 mmol/L   O2 Saturation 65.0     Acid-base deficit 6.0 (*) 0.0 - 2.0 mmol/L   Patient temperature 97.2 F     Collection site IV START     Drawn by RT     Sample type VENOUS     Comment NOTIFIED PHYSICIAN     No results found.  Medications  naloxone (NARCAN) injection 2 mg (2 mg Intravenous Given 10/31/13 0249)  potassium chloride 10 mEq in 100 mL IVPB (10 mEq Intravenous New Bag/Given 10/31/13 0453)  sodium chloride 0.9 % bolus 1,000 mL (0 mLs Intravenous Stopped 10/31/13 0347)  sodium chloride 0.9 % bolus 1,000 mL (1,000 mLs Intravenous New Bag/Given 10/31/13 0453)   Patient had a positive response to narcan but has no narcotics in the urine.   MDM Reviewed: previous chart, nursing note and vitals (records obtained from [facility] ) Interpretation: labs and ECG (hypokalemia.  Orginal EKG with prolongation of the QTC) Total time providing critical care: > 105 minutes. This excludes time spent performing separately reportable procedures and services. Consults:  admitting MD (EDP personally spoke with Pattyu at [facility] control and 2 times with Dr. Conley Rolls)     Patient now admits to 6-10 trileptal, seroquel 6, trileptal 6-10, topamax 5-7, doxepin 5-6, and excedrin migraine and a pm preparation family states she has iburpofen pm at home.  Per poison control, patty... Repeat tylenol salicylate lactate and electrolytes.    Have updated Houston Siren on medications ingested.    Date: 10/31/2013  Rate: 103  Rhythm: sinus tachycardia  QRS Axis: normal  Intervals: QT prolonged  ST/T Wave abnormalities: nonspecific ST changes  Conduction Disutrbances:none  Narrative Interpretation:   Old EKG Reviewed: none available    Date: 10/31/2013  Rate: 97  Rhythm: normal sinus rhythm  QRS Axis: normal  Intervals: normal  ST/T Wave abnormalities: nonspecific ST changes  Conduction Disutrbances:none  Narrative Interpretation:   Old EKG Reviewed: changes noted  Improved alertness, will continue IVF, potassium IV  and will maintain NPO  CRITICAL CARE Performed by: Jasmine AwePALUMBO-RASCH,Annye Forrey K Total critical care time: 120 minutes Critical care time was exclusive of separately billable procedures and treating other patients. Critical care was necessary to treat or prevent imminent or life-threatening deterioration. Critical care was time spent personally by me on the following activities: development of treatment plan with patient and/or surrogate as well as nursing, discussions with consultants, evaluation of patient's response to treatment, examination of patient, obtaining history from patient or surrogate, ordering and performing treatments and interventions, ordering and review of laboratory studies, ordering and review of radiographic studies, pulse oximetry and re-evaluation of patient's condition.   Jasmine AweApril K Rameses Ou-Rasch, MD 10/31/13 [phone]

## 2013-11-01 ENCOUNTER — Encounter (HOSPITAL_COMMUNITY): Payer: Self-pay | Admitting: *Deleted

## 2013-11-01 ENCOUNTER — Inpatient Hospital Stay (HOSPITAL_COMMUNITY)
Admission: AD | Admit: 2013-11-01 | Discharge: 2013-11-04 | DRG: 885 | Disposition: A | Discharge status: Home / Self Care | Payer: BC Managed Care – PPO | Source: Intra-hospital | Attending: Psychiatry | Admitting: Psychiatry

## 2013-11-01 DIAGNOSIS — R45851 Suicidal ideations: Secondary | ICD-10-CM

## 2013-11-01 DIAGNOSIS — T50902A Poisoning by unspecified drugs, medicaments and biological substances, intentional self-harm, initial encounter: Secondary | ICD-10-CM

## 2013-11-01 DIAGNOSIS — Z79899 Other long term (current) drug therapy: Secondary | ICD-10-CM

## 2013-11-01 DIAGNOSIS — F411 Generalized anxiety disorder: Secondary | ICD-10-CM | POA: Diagnosis present

## 2013-11-01 DIAGNOSIS — T1491XA Suicide attempt, initial encounter: Secondary | ICD-10-CM

## 2013-11-01 DIAGNOSIS — F339 Major depressive disorder, recurrent, unspecified: Secondary | ICD-10-CM | POA: Diagnosis present

## 2013-11-01 DIAGNOSIS — T50901A Poisoning by unspecified drugs, medicaments and biological substances, accidental (unintentional), initial encounter: Secondary | ICD-10-CM

## 2013-11-01 DIAGNOSIS — F329 Major depressive disorder, single episode, unspecified: Secondary | ICD-10-CM

## 2013-11-01 DIAGNOSIS — F909 Attention-deficit hyperactivity disorder, unspecified type: Secondary | ICD-10-CM

## 2013-11-01 DIAGNOSIS — F3160 Bipolar disorder, current episode mixed, unspecified: Principal | ICD-10-CM | POA: Diagnosis present

## 2013-11-01 HISTORY — DX: Major depressive disorder, single episode, unspecified: F32.9

## 2013-11-01 HISTORY — DX: Anxiety disorder, unspecified: F41.9

## 2013-11-01 HISTORY — DX: Bipolar disorder, unspecified: F31.9

## 2013-11-01 HISTORY — DX: Depression, unspecified: F32.A

## 2013-11-01 LAB — BASIC METABOLIC PANEL
BUN: 9 mg/dL (ref 6–23)
CO2: 18 mEq/L — ABNORMAL LOW (ref 19–32)
CREATININE: 0.73 mg/dL (ref 0.50–1.10)
Calcium: 9 mg/dL (ref 8.4–10.5)
Chloride: 106 mEq/L (ref 96–112)
GFR calc Af Amer: >90 mL/min (ref >90)
Glucose, Bld: 89 mg/dL (ref 70–99)
Potassium: 3.9 mEq/L (ref 3.7–5.3)
Sodium: 139 mEq/L (ref 137–147)

## 2013-11-01 LAB — CBC
HEMATOCRIT: 37.8 % (ref 36.0–46.0)
Hemoglobin: 13 g/dL (ref 12.0–15.0)
MCH: 31 pg (ref 26.0–34.0)
MCHC: 34.4 g/dL (ref 30.0–36.0)
MCV: 90.2 fL (ref 78.0–100.0)
Platelets: 338 10*3/uL (ref 150–400)
RBC: 4.19 MIL/uL (ref 3.87–5.11)
RDW: 13.6 % (ref 11.5–15.5)
WBC: 6.3 10*3/uL (ref 4.0–10.5)

## 2013-11-01 MED ORDER — MAGNESIUM HYDROXIDE 400 MG/5ML PO SUSP: 30.0000 mL | Freq: Every day | ORAL | Status: DC | PRN

## 2013-11-01 MED ORDER — TRAZODONE HCL 50 MG PO TABS
50.0000 mg | ORAL_TABLET | Freq: Every day | ORAL | Status: DC
  Administered 2013-11-01: 50 mg via ORAL
  Filled 2013-11-01 (×3): qty 1

## 2013-11-01 MED ORDER — HYDROXYZINE HCL 25 MG PO TABS
25.0000 mg | ORAL_TABLET | Freq: Four times a day (QID) | ORAL | Status: DC | PRN
  Administered 2013-11-03: 25 mg via ORAL
  Filled 2013-11-01 (×2): qty 1
  Filled 2013-11-01: qty 10

## 2013-11-01 MED ORDER — NICOTINE 21 MG/24HR TD PT24: 21.0000 mg | MEDICATED_PATCH | Freq: Every day | TRANSDERMAL | Status: DC

## 2013-11-01 MED ORDER — ACETAMINOPHEN 325 MG PO TABS
650.0000 mg | ORAL_TABLET | Freq: Four times a day (QID) | ORAL | Status: DC | PRN
  Administered 2013-11-02 – 2013-11-04 (×2): 650 mg via ORAL
  Filled 2013-11-01 (×2): qty 2

## 2013-11-01 MED ORDER — NICOTINE 21 MG/24HR TD PT24
21.0000 mg | MEDICATED_PATCH | Freq: Every day | TRANSDERMAL | Status: DC
  Administered 2013-11-02 – 2013-11-04 (×3): 21 mg via TRANSDERMAL
  Filled 2013-11-01: qty 14
  Filled 2013-11-01 (×6): qty 1

## 2013-11-01 MED ORDER — ALUM & MAG HYDROXIDE-SIMETH 200-200-20 MG/5ML PO SUSP: 30.0000 mL | ORAL | Status: DC | PRN

## 2013-11-01 NOTE — Clinical Social Work Psych Assess (Signed)
Clinical Social Work Department CLINICAL SOCIAL WORK PSYCHIATRY SERVICE LINE ASSESSMENT 11/01/2013  Patient:  Heather Preston  Account:  [id number]  Admit Date:  10/31/2013  Clinical Social Worker:  Read Drivers  Date/Time:  11/01/2013 02:42 PM Referred by:  Physician  Date referred:  11/01/2013 Reason for Referral  Crisis Intervention  Behavioral Health Issues   Presenting Symptoms/Problems (In the person's/family's own words):   Psych was consulted as a result of pt admission for overdose.   Abuse/Neglect/Trauma History (check all that apply)  Witness to trauma   Abuse/Neglect/Trauma Comments:   Pt was not eye witness to trauma, but did lose her best girlfriend suddenly at Christmas.  Cause of death unknown. Pt is struggling coping with this loss.   Psychiatric History (check all that apply)  Inpatient/hospitilization  Outpatient treatment   Psychiatric medications:  Prior to dc pt had access to: Topamax, Trileptal, Seroquel, doxepin and Brittnilex    no meds currently administered   Current Mental Health Hospitalizations/Previous Mental Health History:   Pt reports being inpatient at [facility] and recently dc'd.  Pt stated that she was there for approx 3 months.   Current provider:   onoging therapy - DBT   Place and Date:   ongoing   Current Medications:   [place], [place] #[id number] (CSN: [id number])  (33 y.o. F) (Adm: 10/31/13)   [id number]-01        Scheduled Meds Sorted by Name   for Estefany, Goebel as of 11/01/13 1457   Legend:    Given Hold Not Given Due Canceled Entry Other Actions    Time Time (Time) Time Time Time-Action      Inactive     Active     Linked    Medications 11/01/13 11/02/13 11/03/13 11/04/13 11/05/13 11/06/13 11/07/13  heparin injection 5,000 Units  Dose: 5,000 Units Freq: 3 times per day Route: [place]  Start: 10/31/13 0900   0600      (1400) [C]      2200       0600      1400      2200       0600      1400       2200       0600      1400      2200       0600      1400      2200       0600      1400      2200       0600      1400      2200        nicotine (NICODERM CQ - dosed in mg/24 hours) patch 21 mg  Dose: 21 mg Freq: Daily Route: TD  Start: 10/31/13 1200   Admin Instructions:  Remove old patch before applying new patch   0959      1013       0959      1000       1000       1000       1000       1000       1000            Continuous Meds Sorted by Name   for Jaydalee, [place] as of 11/01/13 1457   Legend:    Given Hold Not Given Due Canceled Entry Other Actions  Time Time (Time) Time Time Time-Action      Inactive     Active     Linked    Medications 11/01/13 11/02/13 11/03/13 11/04/13 11/05/13 11/06/13 11/07/13      PRN Meds Sorted by Name   for Denzil Magnusonnnan, [place] as of 11/01/13 1457   Legend:    Given Hold Not Given Due Canceled Entry Other Actions    Time Time (Time) Time Time Time-Action      Inactive     Active     Linked    Medications 11/01/13 11/02/13 11/03/13 11/04/13 11/05/13 11/06/13 11/07/13  naloxone (NARCAN) injection 2 mg  Dose: 2 mg Freq: As needed Route: IV  PRN Reason: opioid reversal  Start: 10/31/13 0243   Previous Impatient Admission/Date/Reason:   [facility]   Emotional Health / Current Symptoms    Suicide/Self Harm  Suicide attempt in past (date/description)   Suicide attempt in the past:   Upon admission pt attempted suicide via overdose   Other harmful behavior:   pt non-compliant with anti-depressant meds due to no insurance and access to healthcare.   Psychotic/Dissociative Symptoms  None reported   Other Psychotic/Dissociative Symptoms:   none reported or noted in chart    Attention/Behavioral Symptoms  Impulsive  Restless   Other Attention / Behavioral Symptoms:   none reported or noted in chart    Cognitive Impairment  Poor Judgement  Poor/Impaired  Decision-Making  Orientation - Time  Orientation - Situation  Orientation - Self  Orientation - Place   Other Cognitive Impairment:   none reported or noted in chart    Mood and Adjustment  Anxious  DEPRESSION  Unstable/Inconsistent    Stress, Anxiety, Trauma, Any Recent Loss/Stressor  Anxiety  Grief/Loss (recent or history)   Anxiety (frequency):   moderate anxiety associated with her loss/grief of best girlfriend's death at Christmas.   Phobia (specify):   none reported or noted in chart   Compulsive behavior (specify):   none reported or noted in chart   Obsessive behavior (specify):   none reported or noted in chart   Other:   none reported other than that outlined above   Substance Abuse/Use  None   SBIRT completed (please refer for detailed history):  N  Self-reported substance use:   Pt denies SA UDS negative   Urinary Drug Screen Completed:  Y Alcohol level:   BAL 76 upon admission    Environmental/Housing/Living Arrangement  Stable housing   Who is in the home:   Pt reports living alone   Emergency contact:  Onalee HuaDavid, father   Personnel officerinancial  Private Insurance   Patient's Strengths and Goals (patient's own words):   Pt is reaching out for assistance and is agreeable to inpatient treatment.   Clinical Social Worker's Interpretive Summary:   Psych CSW assessed pt at bedside.  Pt was alert and oriented x4.  Pt was accompanied by a sitter for suicide precautions.  The sitter remained throughout the assessment.    Pt reports that she recently underwent inpatient therapy [facility] where she was a resident for approximatly 3 months.  Pt stated that she has been feeling very depressed and anxious over the past two months.  Pt stated that upon her [facility] - her best girlfriend was found dead on Christmas.  The cause of death remains unknown.  The pt states that she is having a difficult time coping with this loss and often feels  hopeless and  feels as if there is nothing else to live for.    Pt states that she had a "stash" of old anti-depressants as well as some of the newer prescriptions the night she attempted suicide by overdose.  Pt stated that she took all of what she could get her hands on.  Pt was found by her mother who drove the pt to the ED.  History was obtained by pt mother upon arrival.    Pt has attempted suicide in the past at least one other time where the means was also overdose of prescription meds.  Pt admits that this was an attempt to take her own life.  Pt expresses a difficult time dealing with being unemployed and not having things that most 50 year olds have.    Pt endorses sx of depression including: feelings of worthlessness, hopelessness, thoughts of death and suicide attempt, anhedonia, socially withdrawn, loss of appetite, increased sleep and decreased mood.    Pt reports seeing a psychiatrist on an outpatient basis of which she has not seen recently.  Pt reports she is undergoing DBT on a consistent basis.    Pt has been evaluated by psychiatry and is agreeable to inpatient psychiatric treatment though she feels that it "will not help".  pt states that she has been inpatient before and got out and tried to hurt herself again.  Pt is fearful that nothing will work for her.    Psych CSW acknowledged pt concerns.  Psych CSW offered support and encouragement to pt to be open minded throughout therapy.  Pt agreeable and open to change and therapy.  Pt remains hopeless.   Disposition:  Inpatient referral made [facility], Geri-psych)  Vickii Penna, [place] [phone]  Clinical Social Work

## 2013-11-01 NOTE — Discharge Summary (Signed)
Physician Discharge Summary  Jabria Loos ZOX:[id number] DOB: [birth year] DOA: 10/31/2013  PCP: No primary provider on file.  Admit date: 10/31/2013 Discharge date: 11/01/2013  Time spent: 35 minutes  Recommendations for Outpatient Follow-up:  Discharge to inpatient psychiatry   Discharge Diagnoses:  Active Problems:   Overdose   Depression, major   Tobacco abuse   Suicide attempt   Abnormal EKG   Intentional drug overdose   Discharge Condition: stable  Diet recommendation: regular  Filed Weights   10/31/13 0235  Weight: 56.7 kg (125 lb)    History of present illness:  Heather Preston is a 33 y.o. female with a history of major depression who presents with overdose. She staes that she has been very depressed for several reasons- she has recently been laid off from her job and her best friend passed away recently. She states that she took about seroquel 6, trileptal 6-10, topamax 5-7, doxepin 5-6, and excedrin migraine as well as a bottle of wine in an attempt to "numb her pain". She denies overt suicidal intention, buts says that she didn't really care whether she woke up again. She then called her parents who brought her to the [facility] ED and then was transferred to [facility]. On presentation she was groggy but able to protect airway. EKG with QT prolongation and tachycardia, hypokalemia at 3.0. UDS negative. ETOH positive. Acetaminophen and salicylate levels negative.   Hospital Course:  1. Overdose: On presentation had low potassium, EKG changes and somnolence.  Electrolytes corrected with repletion and oral intake.  No longer somnolent. No events on telemetry and QT interval has normalized. Stable. 2. Suicide attempt: Has been seen by psychiatry inpatient and will transfer to [facility] for further inpatient psychiatry care. 3. Major depression: I have taken her off of her medications at this point due to overdose. Will leave decisions on future treatment of depression to psychiatry  service. 4. Abnormal EKG: QT interval has normalized. No events on telemetry.  Procedures:  none  Consultations:  psychiatry  Discharge Exam: Filed Vitals:   11/01/13 1147  BP: 103/74  Pulse: 92  Temp: 97.7 F (36.5 C)  Resp: 18    General: NAD, calm, resting in bed Cardiovascular: RRR no mrg Respiratory: CTAB, good air movement Musculoskeletal: no edema, strength 5/5, Neuro: no tremor, CNII-XII intact, cerebellar exam normal. Grossly non focal neurologic exam   Discharge Instructions  Discharge Orders   Future Orders Complete By Expires   Diet - low sodium heart healthy  As directed    Increase activity slowly  As directed        Medication List    STOP taking these medications       amphetamine-dextroamphetamine 15 MG tablet  Commonly known as:  ADDERALL     BRINTELLIX 20 MG Tabs  Generic drug:  Vortioxetine HBr     clonazePAM 1 MG tablet  Commonly known as:  KLONOPIN     Diphenhydramine-APAP (sleep) 38-500 MG Tabs     doxepin 50 MG capsule  Commonly known as:  SINEQUAN     EXCEDRIN TENSION HEADACHE 500-65 MG Tabs  Generic drug:  Acetaminophen-Caffeine     hydrOXYzine 50 MG capsule  Commonly known as:  VISTARIL     Oxcarbazepine 300 MG tablet  Commonly known as:  TRILEPTAL     SEROQUEL PO     topiramate 25 MG tablet  Commonly known as:  TOPAMAX      TAKE these medications       nicotine  21 mg/24hr patch  Commonly known as:  NICODERM CQ - dosed in mg/24 hours  Place 1 patch (21 mg total) onto the skin daily.       No Known Allergies    The results of significant diagnostics from this hospitalization (including imaging, microbiology, ancillary and laboratory) are listed below for reference.    Significant Diagnostic Studies: No results found.  Microbiology: Recent Results (from the past 240 hour(s))  MRSA PCR SCREENING     Status: None   Collection Time    10/31/13  7:03 AM      Result Value Range Status   MRSA by PCR  NEGATIVE  NEGATIVE Final   Comment:            The GeneXpert MRSA Assay (FDA     approved for NASAL specimens     only), is one component of a     comprehensive MRSA colonization     surveillance program. It is not     intended to diagnose MRSA     infection nor to guide or     monitor treatment for     MRSA infections.     Labs: Basic Metabolic Panel:  Recent Labs Lab 10/31/13 0250 10/31/13 1005 10/31/13 1707 11/01/13 0305  NA 143 143 139 139  K 3.0* 4.0 4.0 3.9  CL 101 115* 109 106  CO2 22 17* 18* 18*  GLUCOSE 102* 90 95 89  BUN 10 7 7 9   CREATININE 0.70 0.62  0.63 0.65 0.73  CALCIUM 9.5 8.4 9.1 9.0  MG 2.0  --   --   --    Liver Function Tests:  Recent Labs Lab 10/31/13 0250  AST 16  ALT 14  ALKPHOS 74  BILITOT <0.2*  PROT 7.7  ALBUMIN 4.2   No results found for this basename: LIPASE, AMYLASE,  in the last 168 hours No results found for this basename: AMMONIA,  in the last 168 hours CBC:  Recent Labs Lab 10/31/13 0250 10/31/13 1005 11/01/13 0305  WBC 7.7 7.1 6.3  NEUTROABS 3.8  --   --   HGB 13.3 11.5* 13.0  HCT 39.0 33.8* 37.8  MCV 89.4 89.9 90.2  PLT 348 290 338   Cardiac Enzymes:  Recent Labs Lab 10/31/13 0250 10/31/13 1219 10/31/13 1704 10/31/13 2250  TROPONINI <0.30 <0.30 <0.30 <0.30   BNP: BNP (last 3 results) No results found for this basename: PROBNP,  in the last 8760 hours CBG:  Recent Labs Lab 10/31/13 0244  GLUCAP 94       Signed:  WALSH,CATHERINE  [facility] 11/01/2013, 11:54 AM

## 2013-11-01 NOTE — Progress Notes (Signed)
Patient ID: Heather MagnusonKrista Curet, female   DOB: 07/18/1981, 33 y.o.   MRN: [id number] D: pt. In bed most of the evening except up to bathroom. Pt. Reports " a little bit out of it", conversation minimum and curt, brief eye contact, Respirations even, unlabored. A: Writer introduced self to client, reviewed medication administration times and encouraged her to verbalize any concerns. Staff will monitor q115min for safety. Writer encouraged group. R: Pt. Is safe on the unit. Pt. Did not attend group.

## 2013-11-01 NOTE — Progress Notes (Signed)
[facility] PROGRESS NOTE  Denzil MagnusonKrista Culliver WUJ:[id number] DOB: 12/11/1980 DOA: 10/31/2013 PCP: No primary provider on file.  Assessment/Plan: 1. Overdose multiple medications: Mental status stable without sedation or confusion. Labs this am are stable, no electrolyte abnormalities, very minor decrease in CO2.  No events on telemetry.  Medically stable and ready for discharge 2. Major depression with suicide attempt: Psychiatry recommends inpatient treatment. Will transfer to behavioral health when bed available.  Psychiatry recs appreciated on medication management. Corporate investment bankerContinue sitter. 3. Tobacco abuse  Code Status: full Family Communication: spoke with patient at bedside. Spoke with parents yesterday, they understand recommendation for inpatient treatment [facility].   Consultants:  Psychiatry Dr. Lolly MustacheArfeen  Procedures:  none  Antibiotics:  none  HPI/Subjective: Calm this morning. No confusion, nausea, headache or other complaint.   Objective: Filed Vitals:   11/01/13 0414  BP: 92/68  Pulse: 82  Temp: 98 F (36.7 C)  Resp: 14    Intake/Output Summary (Last 24 hours) at 11/01/13 0734 Last data filed at 10/31/13 1700  Gross per 24 hour  Intake    420 ml  Output      0 ml  Net    420 ml   Filed Weights   10/31/13 0235  Weight: 56.7 kg (125 lb)    Exam:   General:  Alert, oriented, no distress  Cardiovascular: rrr no mrg  Respiratory: CTAB good air movment  Abdomen: BS+ soft non tender  Musculoskeletal: no edema, strength 5/5,   Neuro:  no tremor, CNII-XII intact, cerebellar exam normal. Grossly non focal neurologic exam  Data Reviewed: Basic Metabolic Panel:  Recent Labs Lab 10/31/13 0250 10/31/13 1005 10/31/13 1707 11/01/13 0305  NA 143 143 139 139  K 3.0* 4.0 4.0 3.9  CL 101 115* 109 106  CO2 22 17* 18* 18*  GLUCOSE 102* 90 95 89  BUN 10 7 7 9   CREATININE 0.70 0.62  0.63 0.65 0.73  CALCIUM 9.5 8.4  9.1 9.0  MG 2.0  --   --   --    Liver Function Tests:  Recent Labs Lab 10/31/13 0250  AST 16  ALT 14  ALKPHOS 74  BILITOT <0.2*  PROT 7.7  ALBUMIN 4.2   No results found for this basename: LIPASE, AMYLASE,  in the last 168 hours No results found for this basename: AMMONIA,  in the last 168 hours CBC:  Recent Labs Lab 10/31/13 0250 10/31/13 1005 11/01/13 0305  WBC 7.7 7.1 6.3  NEUTROABS 3.8  --   --   HGB 13.3 11.5* 13.0  HCT 39.0 33.8* 37.8  MCV 89.4 89.9 90.2  PLT 348 290 338   Cardiac Enzymes:  Recent Labs Lab 10/31/13 0250 10/31/13 1219 10/31/13 1704 10/31/13 2250  TROPONINI <0.30 <0.30 <0.30 <0.30   BNP (last 3 results) No results found for this basename: PROBNP,  in the last 8760 hours CBG:  Recent Labs Lab 10/31/13 0244  GLUCAP 94    Recent Results (from the past 240 hour(s))  MRSA PCR SCREENING     Status: None   Collection Time    10/31/13  7:03 AM      Result Value Range Status   MRSA by PCR NEGATIVE  NEGATIVE Final   Comment:            The GeneXpert MRSA Assay (FDA     approved for NASAL specimens     only), is one component of a  comprehensive MRSA colonization     surveillance program. It is not     intended to diagnose MRSA     infection nor to guide or     monitor treatment for     MRSA infections.     Studies: No results found.  Scheduled Meds: . heparin  5,000 Units Subcutaneous Q8H  . nicotine  21 mg Transdermal Daily   Continuous Infusions:   Active Problems:   Overdose   Depression, major   Tobacco abuse   Suicide attempt   Abnormal EKG   Intentional drug overdose    Time spent: 35 minutes   [facility]  [facility] Pager [phone]. If 7PM-7AM, please contact night-coverage at [email or web address], password [facility] 11/01/2013, 7:34 AM  LOS: 1 day

## 2013-11-01 NOTE — Clinical Social Work Psych Note (Signed)
Psych CSW called [facility] to assist with transportation needs.  [phone]  Vickii PennaGina Takiyah Bohnsack, LCSWA [phone]  Clinical Social Work

## 2013-11-01 NOTE — Tx Team (Signed)
Initial Interdisciplinary Treatment Plan  PATIENT STRENGTHS: (choose at least two) Ability for insight Average or above average intelligence Capable of independent living General fund of knowledge  PATIENT STRESSORS: Loss of friend   PROBLEM LIST: Problem List/Patient Goals Date to be addressed Date deferred Reason deferred Estimated date of resolution  Depression 11/01/13     Anxiety 11/01/13                                                DISCHARGE CRITERIA:  Ability to meet basic life and health needs Improved stabilization in mood, thinking, and/or behavior Verbal commitment to aftercare and medication compliance  PRELIMINARY DISCHARGE PLAN: Attend aftercare/continuing care group Return to previous living arrangement  PATIENT/FAMIILY INVOLVEMENT: This treatment plan has been presented to and reviewed with the patient, Heather Preston, and/or family member, .  The patient and family have been given the opportunity to ask questions and make suggestions.  Heather Preston, [name] 11/01/2013, 4:47 PM

## 2013-11-01 NOTE — Clinical Social Work Psych Note (Signed)
Psych CSW was informed that [facility].  Psych CSW completed pre-admission paperwork and [facility].  Psych CSW informed RN that pt bed is ready.  [facility].  [phone]  RN to call report: [phone]  Vickii PennaGina Zandria Woldt, LCSWA [phone]  Clinical Social Work

## 2013-11-01 NOTE — Progress Notes (Signed)
Pt did not attend group. She has been sleeping in bed since start of shift.

## 2013-11-01 NOTE — Progress Notes (Signed)
Report called to Neill Loftarol Davis RN @ [facility]. Patient made aware of transfer. [facility] to be [facility]

## 2013-11-01 NOTE — Treatment Plan (Signed)
[facility] Dr. Lolly MustacheArfeen, room 507-1.  Bed is ready.

## 2013-11-01 NOTE — Progress Notes (Signed)
October 2014, 2 days inpatient  [facility], spoke to Safeco CorporationPerisa Harrison, Pharm Tech  OLD MED[facility] Seroquel 400 qhs Topamax 100mg  qhs Trazodone 100mg  qhs Neurontin 300mg  tid Buspar 10mg  tid Adderol XR 30mg  daily Brintellix 20mg  daily Klonopin: Stopped at discharge   Faxed medical records release signed by pt to:  [phone]    ATTN: April  Outpatient may be different; awaiting receipt of information before ordering home meds.   Claudette Headonrad Virlan Kempker, MSN, FNP-BC at 17:02, 11/01/2013

## 2013-11-01 NOTE — Progress Notes (Signed)
33 year old female pt admitted on voluntary basis. Pt was in ICU before [facility] due to an overdose attempt. Pt denies that she did this on purpose and states that she was having a panic attack and overtook some medications, she goes on to say that she did not feel right and called her parents who then in turn called EMS who picked up pt and brought to hospital. On admission, pt denies SI and able to contract for safety on the unit. Pt states [place] [facility] [place] [facility] [place] just a few weeks ago. Pt stated she is taking meds as prescribed and does not want them changed here as she states they were working effectively for her. Pt was oriented to the unit and safety maintained.

## 2013-11-02 ENCOUNTER — Encounter (HOSPITAL_COMMUNITY): Payer: Self-pay | Admitting: Family

## 2013-11-02 DIAGNOSIS — F329 Major depressive disorder, single episode, unspecified: Secondary | ICD-10-CM

## 2013-11-02 DIAGNOSIS — T50904A Poisoning by unspecified drugs, medicaments and biological substances, undetermined, initial encounter: Secondary | ICD-10-CM

## 2013-11-02 DIAGNOSIS — T50901A Poisoning by unspecified drugs, medicaments and biological substances, accidental (unintentional), initial encounter: Secondary | ICD-10-CM

## 2013-11-02 DIAGNOSIS — F3160 Bipolar disorder, current episode mixed, unspecified: Principal | ICD-10-CM

## 2013-11-02 DIAGNOSIS — F909 Attention-deficit hyperactivity disorder, unspecified type: Secondary | ICD-10-CM

## 2013-11-02 DIAGNOSIS — X838XXA Intentional self-harm by other specified means, initial encounter: Secondary | ICD-10-CM

## 2013-11-02 LAB — URINALYSIS, ROUTINE W REFLEX MICROSCOPIC
Bilirubin Urine: NEGATIVE
GLUCOSE, UA: NEGATIVE mg/dL
Hgb urine dipstick: NEGATIVE
KETONES UR: NEGATIVE mg/dL
Leukocytes, UA: NEGATIVE
Nitrite: POSITIVE — AB
Protein, ur: NEGATIVE mg/dL
SPECIFIC GRAVITY, URINE: 1.019 (ref 1.005–1.030)
Urobilinogen, UA: 0.2 mg/dL (ref 0.0–1.0)
pH: 6 (ref 5.0–8.0)

## 2013-11-02 LAB — URINE MICROSCOPIC-ADD ON

## 2013-11-02 MED ORDER — VORTIOXETINE HBR 20 MG PO TABS
20.0000 mg | ORAL_TABLET | Freq: Every day | ORAL | Status: DC
  Administered 2013-11-02 – 2013-11-04 (×3): 20 mg via ORAL
  Filled 2013-11-02 (×2): qty 20
  Filled 2013-11-02: qty 5
  Filled 2013-11-02 (×2): qty 20

## 2013-11-02 MED ORDER — BISACODYL 10 MG RE SUPP
10.0000 mg | Freq: Once | RECTAL | Status: AC
  Administered 2013-11-02: 10 mg via RECTAL
  Filled 2013-11-02: qty 1

## 2013-11-02 MED ORDER — OXCARBAZEPINE 300 MG PO TABS
900.0000 mg | ORAL_TABLET | Freq: Every day | ORAL | Status: DC
  Administered 2013-11-02 – 2013-11-03 (×2): 900 mg via ORAL
  Filled 2013-11-02 (×2): qty 3
  Filled 2013-11-02: qty 15
  Filled 2013-11-02 (×2): qty 3

## 2013-11-02 MED ORDER — DOCUSATE SODIUM 100 MG PO CAPS
100.0000 mg | ORAL_CAPSULE | Freq: Two times a day (BID) | ORAL | Status: DC
  Administered 2013-11-02 – 2013-11-04 (×4): 100 mg via ORAL
  Filled 2013-11-02 (×8): qty 1

## 2013-11-02 MED ORDER — AMPHETAMINE-DEXTROAMPHETAMINE 10 MG PO TABS
15.0000 mg | ORAL_TABLET | Freq: Two times a day (BID) | ORAL | Status: DC
  Administered 2013-11-02 – 2013-11-04 (×5): 15 mg via ORAL
  Filled 2013-11-02 (×11): qty 2

## 2013-11-02 MED ORDER — DOXEPIN HCL 50 MG PO CAPS
100.0000 mg | ORAL_CAPSULE | Freq: Every day | ORAL | Status: DC
  Administered 2013-11-02 – 2013-11-03 (×2): 100 mg via ORAL
  Filled 2013-11-02: qty 1
  Filled 2013-11-02: qty 10
  Filled 2013-11-02: qty 1
  Filled 2013-11-02: qty 4
  Filled 2013-11-02: qty 1

## 2013-11-02 MED ORDER — POLYETHYLENE GLYCOL 3350 17 G PO PACK: 17.0000 g | PACK | Freq: Two times a day (BID) | ORAL | Status: DC | PRN

## 2013-11-02 NOTE — Progress Notes (Signed)
Pt playing on phone and slipped and went to the ground and jumped up, not observed by this nurse, pt did not sustain any injuries or go to the ground completely, as stated by the pt.

## 2013-11-02 NOTE — [facility] Suicide Risk Assessment (Signed)
Suicide Risk Assessment  Admission Assessment     Nursing information obtained from:    Demographic factors:    Current Mental Status:    Loss Factors:    Historical Factors:    Risk Reduction Factors:     CLINICAL FACTORS:   Severe Anxiety and/or Agitation Bipolar Disorder:   Mixed State Depression:   Anhedonia Hopelessness Impulsivity Insomnia Recent sense of peace/wellbeing Severe More than one psychiatric diagnosis Unstable or Poor Therapeutic Relationship Previous Psychiatric Diagnoses and Treatments Medical Diagnoses and Treatments/Surgeries  COGNITIVE FEATURES THAT CONTRIBUTE TO RISK:  Closed-mindedness Loss of executive function Polarized thinking    SUICIDE RISK:   Moderate:  Frequent suicidal ideation with limited intensity, and duration, some specificity in terms of plans, no associated intent, good self-control, limited dysphoria/symptomatology, some risk factors present, and identifiable protective factors, including available and accessible social support.  PLAN OF CARE: Admit for crisis stabilization, safety monitoring and [facility] after overdose on her medication as a suicide attempt. Patient will receive individual, group and milieu therapy and medication management  I certify that inpatient services furnished can reasonably be expected to improve the patient's condition.  Markail Diekman,JANARDHAHA R. 11/02/2013, 11:53 AM

## 2013-11-02 NOTE — [facility] Group Notes (Signed)
[facility] LCSW Group Therapy      Feelings About Diagnosis 1:15 - 2:30 PM         11/02/2013  3:22 PM    Type of Therapy:  Group Therapy  Participation Level:  Did not attend group.   Wynn BankerHodnett, Rahiem Schellinger Hairston 11/02/2013  3:22 PM

## 2013-11-02 NOTE — [facility] Suicide Risk Assessment (Signed)
[facility] INPATIENT:  Family/Significant Other Suicide Prevention Education  Suicide Prevention Education:  Education Completed; Heather DuboisDavid Farias, Father, [phone]; has been identified by the patient as the family member/significant other with whom the patient will be residing, and identified as the person(s) who will aid the patient in the event of a mental health crisis (suicidal ideations/suicide attempt).  With written consent from the patient, the family member/significant other has been provided the following suicide prevention education, prior to the and/or following the discharge of the patient.  The suicide prevention education provided includes the following:  Suicide risk factors  Suicide prevention and interventions  National Suicide Hotline telephone number  [facility] assessment telephone number  [facility] Emergency Assistance 911  [facility] and/or Residential Mobile Crisis Unit telephone number  Request made of family/significant other to:  Remove weapons (e.g., guns, rifles, knives), all items previously/currently identified as safety concern.  Father advised patient does not have access to guns.  Remove drugs/medications (over-the-counter, prescriptions, illicit drugs), all items previously/currently identified as a safety concern.  The family member/significant other verbalizes understanding of the suicide prevention education information provided.  The family member/significant other agrees to remove the items of safety concern listed above.  Heather BankerHodnett, Heather Preston 11/02/2013, 3:16 PM

## 2013-11-02 NOTE — Progress Notes (Signed)
Patient ID: Heather MagnusonKrista Lablanc, female   DOB: 11/16/1980, 33 y.o.   MRN: [id number] D- Patient reports poor sleep and good appetite.  Her energy level is low and she feels weak and shaky.  She is a fall risk.  Fall precautions reviewed.  R- Patient is complaining of constipation and discomfort on urination.  Says she has a history of UTI's Urine sample obtained and patient encouraged to drink fluids.  She has spent most of the day in bed and has felt weak.  Patiet given suppository for constipation of a week and awaiting results.  She is pleasant, cooperative .

## 2013-11-02 NOTE — Progress Notes (Signed)
The focus of this group is to educate the patient on the purpose and policies of crisis stabilization and provide a format to answer questions about their admission.  The group details unit policies and expectations of patients while admitted.  Patient did not attend 0900 nurse education orientation group this morning.  Patient stayed in bed.   

## 2013-11-02 NOTE — [facility] Group Notes (Signed)
Adult Psychoeducational Group Note  Date:  11/02/2013 Time:  9:31 PM  Group Topic/Focus:  Wrap-Up Group:   The focus of this group is to help patients review their daily goal of treatment and discuss progress on daily workbooks.  Participation Level:  Active  Participation Quality:  Appropriate  Affect:  Appropriate  Cognitive:  Appropriate  Insight: Appropriate  Engagement in Group:  Engaged  Modes of Intervention:  Discussion  Additional Comments:  Dot LanesKrista expressed she had a rough start this morning and yesterday when she was admitted.  She expressed that she has been experiencing some financial stressors that lead her to accidentally overdosed.  She said her overdose was brought on by not being able to afford her medication.  She expressed that she has a positive support system if needed but won't ask them to bring her clothes.  She also said that she plans on doing what she can to gain what she can and apply it.  Caroll RancherLindsay, Kaio Kuhlman A 11/02/2013, 9:31 PM

## 2013-11-02 NOTE — H&P (Signed)
Psychiatric Admission Assessment Adult  Patient Identification:  Heather Preston Date of Evaluation:  11/02/2013 Chief Complaint:  MDD recurrent severe History of Present Illness::  Heather Preston is a 33 y.o. female with a history of major depression who presents with overdose. She staes that she has been very depressed for several reasons- she has recently been laid off from her job and her best friend passed away recently. She states that she took about seroquel 6, trileptal 6-10, topamax 5-7, doxepin 5-6, and excedrin migraine as well as a bottle of wine in an attempt to "numb her pain". She denies overt suicidal intention, buts says that she didn't really care whether she woke up again. She then called her parents who brought her to the [facility] ED and then was transferred to [facility]. On presentation she was groggy but able to protect airway. EKG with QT prolongation and tachycardia, hypokalemia at 3.0. UDS negative. ETOH positive. Acetaminophen and salicylate levels negative.   As of 11/02/2013, pt denies SI, HI, and AVH. Pt states that she is concerned about her medications from [facility] and wants to make sure they are correct, stating that "it has taken a long time with many providers to find a balance and I finally found it, I don't feel like myself right now and I just want to get back on my meds if you can verify them" (verified from [facility], updated to current). Pt rates anxiety at 10/10 and depression at 7/10, but states that most of the anxiety is actually from being in here and not being able to work and pay her bills right now. Pt states that she needs to "contact her landlord, who is a friend" so that she can let her know the situation and not get kicked out. She states that he number is in her cell phone so she does not have it on her (nursing staff notified).   Elements:  Location:  Generalized. Quality:  Stable, improving. Severity:  Severe. Timing:  Constant. Duration:   Chronic w/outpt treatment. Associated Signs/Synptoms: Depression Symptoms:  depressed mood, suicidal attempt, anxiety, insomnia, (Hypo) Manic Symptoms:  Denies Anxiety Symptoms:  Excessive Worry, Psychotic Symptoms:  Denies PTSD Symptoms: Negative  Psychiatric Specialty Exam: Physical Exam Full Physical Exam performed in ED; reviewed, stable, and I concur with this assessment.   Review of Systems  Constitutional: Negative.   HENT: Negative.   Eyes: Negative.   Respiratory: Negative.   Cardiovascular: Negative.   Gastrointestinal: Negative.   Genitourinary: Negative.   Musculoskeletal: Negative.   Skin: Negative.   Neurological: Negative.   Endo/Heme/Allergies: Negative.   Psychiatric/Behavioral: Positive for depression (rates 7/10). Negative for suicidal ideas, hallucinations and substance abuse. The patient is nervous/anxious (rates 10/10, mostly financial concerns) and has insomnia (takes home meds for this, reconciled).     Blood pressure 119/90, pulse 111, temperature 97.6 F (36.4 C), temperature source Oral, resp. rate 18, height $RemoveBe'5\' 4"'GRsopVAnI$  (1.626 m), weight 60.328 kg (133 lb).Body mass index is 22.82 kg/(m^2).  General Appearance: Casual  Eye Contact::  Good  Speech:  Clear and Coherent  Volume:  Normal  Mood:  Euthymic  Affect:  Appropriate  Thought Process:  clear, coherent, very focused on obtaining med rec from [facility]  Orientation:  Full (Time, Place, and Person)  Thought Content:  WDL  Suicidal Thoughts:  No  Homicidal Thoughts:  No  Memory:  Immediate;   Good Recent;   Good Remote;   Good  Judgement:  Fair  Insight:  Fair  Psychomotor Activity:  Normal  Concentration:  Good  Recall:  Good  Akathisia:  No  Handed:  Right  AIMS (if indicated):     Assets:  Communication Skills Desire for Improvement Resilience  Sleep:  Number of Hours: 4.5    Past Psychiatric History: Diagnosis: Major depression, recurrent, severe; suicidal attempt   Hospitalizations: [facility] (2 days LOS) in Oct 2014  Outpatient Care: [facility] (current)  Substance Abuse Care: Denies  Self-Mutilation: Denies  Suicidal Attempts: Current  Violent Behaviors: Denies   Past Medical History:   Past Medical History  Diagnosis Date  . Kidney infection   . UTI (lower urinary tract infection)   . ADD (attention deficit disorder)   . Anxiety   . Depression    None. Allergies:  No Known Allergies PTA Medications: Prescriptions prior to admission  Medication Sig Dispense Refill  . nicotine (NICODERM CQ - DOSED IN MG/24 HOURS) 21 mg/24hr patch Place 1 patch (21 mg total) onto the skin daily.  28 patch  0    Previous Psychotropic Medications:  Medication/Dose  See MAR                Substance Abuse History in the last 12 months:  no  Consequences of Substance Abuse: NA  Social History:  reports that she has been smoking Cigarettes.  She has been smoking about 1.00 pack per day. She does not have any smokeless tobacco history on file. She reports that she drinks alcohol. She reports that she does not use illicit drugs. Additional Social History:                      Current Place of Residence:  [place] Place of Birth:  [place] Family Members: parents (mom and dad) Marital Status:  Single Children: DENIES  Sons:  Daughters: Relationships: Single Education:  Dentist Problems/Performance: ADHD Religious Beliefs/Practices: "believe in God" History of Abuse (Emotional/Phsycial/Sexual) Denies Pensions consultant; [employer], Engineer, mining jobs, part-time at [employer] History:  None. Legal History: DUI 5 yrs ago Hobbies/Interests: Animals, dogs, babysitting  Family History:  History reviewed. No pertinent family history.  Results for orders placed during the hospital encounter of 10/31/13 (from the past 72 hour(s))  GLUCOSE, CAPILLARY     Status: None   Collection Time    10/31/13   2:44 AM      Result Value Range   Glucose-Capillary 94  70 - 99 mg/dL  CBC WITH DIFFERENTIAL     Status: None   Collection Time    10/31/13  2:50 AM      Result Value Range   WBC 7.7  4.0 - 10.5 K/uL   RBC 4.36  3.87 - 5.11 MIL/uL   Hemoglobin 13.3  12.0 - 15.0 g/dL   HCT 39.0  36.0 - 46.0 %   MCV 89.4  78.0 - 100.0 fL   MCH 30.5  26.0 - 34.0 pg   MCHC 34.1  30.0 - 36.0 g/dL   RDW 12.8  11.5 - 15.5 %   Platelets 348  150 - 400 K/uL   Neutrophils Relative % 49  43 - 77 %   Neutro Abs 3.8  1.7 - 7.7 K/uL   Lymphocytes Relative 43  12 - 46 %   Lymphs Abs 3.3  0.7 - 4.0 K/uL   Monocytes Relative 5  3 - 12 %   Monocytes Absolute 0.4  0.1 -  1.0 K/uL   Eosinophils Relative 3  0 - 5 %   Eosinophils Absolute 0.2  0.0 - 0.7 K/uL   Basophils Relative 0  0 - 1 %   Basophils Absolute 0.0  0.0 - 0.1 K/uL  COMPREHENSIVE METABOLIC PANEL     Status: Abnormal   Collection Time    10/31/13  2:50 AM      Result Value Range   Sodium 143  137 - 147 mEq/L   Potassium 3.0 (*) 3.7 - 5.3 mEq/L   Chloride 101  96 - 112 mEq/L   CO2 22  19 - 32 mEq/L   Glucose, Bld 102 (*) 70 - 99 mg/dL   BUN 10  6 - 23 mg/dL   Creatinine, Ser 0.70  0.50 - 1.10 mg/dL   Calcium 9.5  8.4 - 10.5 mg/dL   Total Protein 7.7  6.0 - 8.3 g/dL   Albumin 4.2  3.5 - 5.2 g/dL   AST 16  0 - 37 U/L   ALT 14  0 - 35 U/L   Alkaline Phosphatase 74  39 - 117 U/L   Total Bilirubin <0.2 (*) 0.3 - 1.2 mg/dL   GFR calc non Af Amer >90  >90 mL/min   GFR calc Af Amer >90  >90 mL/min   Comment: (NOTE)     The eGFR has been calculated using the CKD EPI equation.     This calculation has not been validated in all clinical situations.     eGFR's persistently <90 mL/min signify possible Chronic Kidney     Disease.  ACETAMINOPHEN LEVEL     Status: None   Collection Time    10/31/13  2:50 AM      Result Value Range   Acetaminophen (Tylenol), Serum <15.0  10 - 30 ug/mL   Comment:            THERAPEUTIC CONCENTRATIONS VARY      SIGNIFICANTLY. A RANGE OF 10-30     ug/mL MAY BE AN EFFECTIVE     CONCENTRATION FOR MANY PATIENTS.     HOWEVER, SOME ARE BEST TREATED     AT CONCENTRATIONS OUTSIDE THIS     RANGE.     ACETAMINOPHEN CONCENTRATIONS     >150 ug/mL AT 4 HOURS AFTER     INGESTION AND >50 ug/mL AT 12     HOURS AFTER INGESTION ARE     OFTEN ASSOCIATED WITH TOXIC     REACTIONS.  SALICYLATE LEVEL     Status: None   Collection Time    10/31/13  2:50 AM      Result Value Range   Salicylate Lvl 2.8  2.8 - 20.0 mg/dL  ETHANOL     Status: Abnormal   Collection Time    10/31/13  2:50 AM      Result Value Range   Alcohol, Ethyl (B) 76 (*) 0 - 11 mg/dL   Comment:            LOWEST DETECTABLE LIMIT FOR     SERUM ALCOHOL IS 11 mg/dL     FOR MEDICAL PURPOSES ONLY  MAGNESIUM     Status: None   Collection Time    10/31/13  2:50 AM      Result Value Range   Magnesium 2.0  1.5 - 2.5 mg/dL  TROPONIN I     Status: None   Collection Time    10/31/13  2:50 AM      Result Value Range  Troponin I <0.30  <0.30 ng/mL   Comment:            Due to the release kinetics of cTnI,     a negative result within the first hours     of the onset of symptoms does not rule out     myocardial infarction with certainty.     If myocardial infarction is still suspected,     repeat the test at appropriate intervals.  PREGNANCY, URINE     Status: None   Collection Time    10/31/13  3:15 AM      Result Value Range   Preg Test, Ur NEGATIVE  NEGATIVE   Comment:            THE SENSITIVITY OF THIS     METHODOLOGY IS >20 mIU/mL.  URINE RAPID DRUG SCREEN (HOSP PERFORMED)     Status: None   Collection Time    10/31/13  3:15 AM      Result Value Range   Opiates NONE DETECTED  NONE DETECTED   Cocaine NONE DETECTED  NONE DETECTED   Benzodiazepines NONE DETECTED  NONE DETECTED   Amphetamines NONE DETECTED  NONE DETECTED   Tetrahydrocannabinol NONE DETECTED  NONE DETECTED   Barbiturates NONE DETECTED  NONE DETECTED   Comment:             DRUG SCREEN FOR MEDICAL PURPOSES     ONLY.  IF CONFIRMATION IS NEEDED     FOR ANY PURPOSE, NOTIFY LAB     WITHIN 5 DAYS.                LOWEST DETECTABLE LIMITS     FOR URINE DRUG SCREEN     Drug Class       Cutoff (ng/mL)     Amphetamine      1000     Barbiturate      200     Benzodiazepine   510     Tricyclics       258     Opiates          300     Cocaine          300     THC              50  CG4 I-STAT (LACTIC ACID)     Status: Abnormal   Collection Time    10/31/13  3:17 AM      Result Value Range   Lactic Acid, Venous 3.29 (*) 0.5 - 2.2 mmol/L  POCT I-STAT 3, BLOOD GAS (G3P V)     Status: Abnormal   Collection Time    10/31/13  3:37 AM      Result Value Range   pH, Ven 7.335 (*) 7.250 - 7.300   pCO2, Ven 35.3 (*) 45.0 - 50.0 mmHg   pO2, Ven 34.0  30.0 - 45.0 mmHg   Bicarbonate 19.0 (*) 20.0 - 24.0 mEq/L   TCO2 20  0 - 100 mmol/L   O2 Saturation 65.0     Acid-base deficit 6.0 (*) 0.0 - 2.0 mmol/L   Patient temperature 97.2 F     Collection site IV START     Drawn by RT     Sample type VENOUS     Comment NOTIFIED PHYSICIAN    MRSA PCR SCREENING     Status: None   Collection Time    10/31/13  7:03 AM      Result  Value Range   MRSA by PCR NEGATIVE  NEGATIVE   Comment:            The GeneXpert MRSA Assay (FDA     approved for NASAL specimens     only), is one component of a     comprehensive MRSA colonization     surveillance program. It is not     intended to diagnose MRSA     infection nor to guide or     monitor treatment for     MRSA infections.  BASIC METABOLIC PANEL     Status: Abnormal   Collection Time    10/31/13 10:05 AM      Result Value Range   Sodium 143  137 - 147 mEq/L   Potassium 4.0  3.7 - 5.3 mEq/L   Chloride 115 (*) 96 - 112 mEq/L   CO2 17 (*) 19 - 32 mEq/L   Glucose, Bld 90  70 - 99 mg/dL   BUN 7  6 - 23 mg/dL   Creatinine, Ser 0.62  0.50 - 1.10 mg/dL   Calcium 8.4  8.4 - 10.5 mg/dL   GFR calc non Af Amer >90  >90 mL/min   GFR  calc Af Amer >90  >90 mL/min   Comment: (NOTE)     The eGFR has been calculated using the CKD EPI equation.     This calculation has not been validated in all clinical situations.     eGFR's persistently <90 mL/min signify possible Chronic Kidney     Disease.  CBC     Status: Abnormal   Collection Time    10/31/13 10:05 AM      Result Value Range   WBC 7.1  4.0 - 10.5 K/uL   RBC 3.76 (*) 3.87 - 5.11 MIL/uL   Hemoglobin 11.5 (*) 12.0 - 15.0 g/dL   HCT 33.8 (*) 36.0 - 46.0 %   MCV 89.9  78.0 - 100.0 fL   MCH 30.6  26.0 - 34.0 pg   MCHC 34.0  30.0 - 36.0 g/dL   RDW 13.3  11.5 - 15.5 %   Platelets 290  150 - 400 K/uL  CREATININE, SERUM     Status: None   Collection Time    10/31/13 10:05 AM      Result Value Range   Creatinine, Ser 0.63  0.50 - 1.10 mg/dL   GFR calc non Af Amer >90  >90 mL/min   GFR calc Af Amer >90  >90 mL/min   Comment: (NOTE)     The eGFR has been calculated using the CKD EPI equation.     This calculation has not been validated in all clinical situations.     eGFR's persistently <90 mL/min signify possible Chronic Kidney     Disease.  TROPONIN I     Status: None   Collection Time    10/31/13 12:19 PM      Result Value Range   Troponin I <0.30  <0.30 ng/mL   Comment:            Due to the release kinetics of cTnI,     a negative result within the first hours     of the onset of symptoms does not rule out     myocardial infarction with certainty.     If myocardial infarction is still suspected,     repeat the test at appropriate intervals.  LACTIC ACID, PLASMA     Status: None  Collection Time    10/31/13 12:19 PM      Result Value Range   Lactic Acid, Venous 1.2  0.5 - 2.2 mmol/L  ACETAMINOPHEN LEVEL     Status: None   Collection Time    10/31/13 12:19 PM      Result Value Range   Acetaminophen (Tylenol), Serum <15.0  10 - 30 ug/mL   Comment:            THERAPEUTIC CONCENTRATIONS VARY     SIGNIFICANTLY. A RANGE OF 10-30     ug/mL MAY BE AN  EFFECTIVE     CONCENTRATION FOR MANY PATIENTS.     HOWEVER, SOME ARE BEST TREATED     AT CONCENTRATIONS OUTSIDE THIS     RANGE.     ACETAMINOPHEN CONCENTRATIONS     >150 ug/mL AT 4 HOURS AFTER     INGESTION AND >50 ug/mL AT 12     HOURS AFTER INGESTION ARE     OFTEN ASSOCIATED WITH TOXIC     REACTIONS.  TROPONIN I     Status: None   Collection Time    10/31/13  5:04 PM      Result Value Range   Troponin I <0.30  <0.30 ng/mL   Comment:            Due to the release kinetics of cTnI,     a negative result within the first hours     of the onset of symptoms does not rule out     myocardial infarction with certainty.     If myocardial infarction is still suspected,     repeat the test at appropriate intervals.  BASIC METABOLIC PANEL     Status: Abnormal   Collection Time    10/31/13  5:07 PM      Result Value Range   Sodium 139  137 - 147 mEq/L   Potassium 4.0  3.7 - 5.3 mEq/L   Chloride 109  96 - 112 mEq/L   CO2 18 (*) 19 - 32 mEq/L   Glucose, Bld 95  70 - 99 mg/dL   BUN 7  6 - 23 mg/dL   Creatinine, Ser 0.65  0.50 - 1.10 mg/dL   Calcium 9.1  8.4 - 10.5 mg/dL   GFR calc non Af Amer >90  >90 mL/min   GFR calc Af Amer >90  >90 mL/min   Comment: (NOTE)     The eGFR has been calculated using the CKD EPI equation.     This calculation has not been validated in all clinical situations.     eGFR's persistently <90 mL/min signify possible Chronic Kidney     Disease.  TROPONIN I     Status: None   Collection Time    10/31/13 10:50 PM      Result Value Range   Troponin I <0.30  <0.30 ng/mL   Comment:            Due to the release kinetics of cTnI,     a negative result within the first hours     of the onset of symptoms does not rule out     myocardial infarction with certainty.     If myocardial infarction is still suspected,     repeat the test at appropriate intervals.  CBC     Status: None   Collection Time    11/01/13  3:05 AM      Result Value Range   WBC 6.3  4.0 -  10.5 K/uL   RBC 4.19  3.87 - 5.11 MIL/uL   Hemoglobin 13.0  12.0 - 15.0 g/dL   HCT 37.8  36.0 - 46.0 %   MCV 90.2  78.0 - 100.0 fL   MCH 31.0  26.0 - 34.0 pg   MCHC 34.4  30.0 - 36.0 g/dL   RDW 13.6  11.5 - 15.5 %   Platelets 338  150 - 400 K/uL  BASIC METABOLIC PANEL     Status: Abnormal   Collection Time    11/01/13  3:05 AM      Result Value Range   Sodium 139  137 - 147 mEq/L   Potassium 3.9  3.7 - 5.3 mEq/L   Chloride 106  96 - 112 mEq/L   CO2 18 (*) 19 - 32 mEq/L   Glucose, Bld 89  70 - 99 mg/dL   BUN 9  6 - 23 mg/dL   Creatinine, Ser 0.73  0.50 - 1.10 mg/dL   Calcium 9.0  8.4 - 10.5 mg/dL   GFR calc non Af Amer >90  >90 mL/min   GFR calc Af Amer >90  >90 mL/min   Comment: (NOTE)     The eGFR has been calculated using the CKD EPI equation.     This calculation has not been validated in all clinical situations.     eGFR's persistently <90 mL/min signify possible Chronic Kidney     Disease.   Psychological Evaluations:  Assessment:   DSM5:  Depressive Disorders:  Major Depressive Disorder - Severe (296.23)  AXIS I:  Major Depression, Recurrent severe AXIS II:  Deferred AXIS III:   Past Medical History  Diagnosis Date  . Kidney infection   . UTI (lower urinary tract infection)   . ADD (attention deficit disorder)   . Anxiety   . Depression    AXIS IV:  other psychosocial or environmental problems and problems related to social environment AXIS V:  41-50 serious symptoms  Treatment Plan/Recommendations:   Review of chart, vital signs, medications, and notes.  1-Individual and group therapy  2-Medication management for depression and anxiety: Medications reviewed with the patient and she stated no untoward effects, but states that she wants to be on her outpatient meds from [facility]. Fax received from that facility: meds updated to match current ones. Discontinued trazodone for sleep. Added Adderol $RemoveBefor'15mg'[id number]$  PO bid for, Trileptal $RemoveBefore'900mg'[id number]$  PO qhs, Brintellix $RemoveBeforeD'20mg'WDjddkgMcebRjl$  PO  daily, Doxepin $RemoveBefore'100mg'BXDGtdkgzttur$  PO qhs. 3-Coping skills for depression, anxiety  4-Continue crisis stabilization and management  5-Address health issues--monitoring vital signs, stable  6-Treatment plan in progress to prevent relapse of depression and anxiety  Treatment Plan Summary: Daily contact with patient to assess and evaluate symptoms and progress in treatment Medication management Current Medications:  Current Facility-Administered Medications  Medication Dose Route Frequency Provider Last Rate Last Dose  . acetaminophen (TYLENOL) tablet 650 mg  650 mg Oral Q6H PRN Benjamine Mola, FNP      . alum & mag hydroxide-simeth (MAALOX/MYLANTA) 200-200-20 MG/5ML suspension 30 mL  30 mL Oral Q4H PRN Benjamine Mola, FNP      . hydrOXYzine (ATARAX/VISTARIL) tablet 25 mg  25 mg Oral Q6H PRN Benjamine Mola, FNP      . magnesium hydroxide (MILK OF MAGNESIA) suspension 30 mL  30 mL Oral Daily PRN Benjamine Mola, FNP      . nicotine (NICODERM CQ - dosed in mg/24 hours) patch 21 mg  21 mg Transdermal Daily  Benjamine Mola, FNP      . traZODone (DESYREL) tablet 50 mg  50 mg Oral QHS Benjamine Mola, FNP   50 mg at 11/01/13 2349     Observation Level/Precautions:  15 minute checks  Laboratory:  Labs resulted, reviewed, and stable at this time.   Psychotherapy:  Group therapy, individual therapy, psychoeducation  Medications:  See MAR above  Consultations: None    Discharge Concerns: None    Estimated LOS: 5-7 days  Other:  N/A   I certify that inpatient services furnished can reasonably be expected to improve the patient's condition.   Benjamine Mola, [place] 1/13/20158:04 AM  Patient was seen for psychiatric evaluation, suicide risk assessment and case discussed with the physician extender. Reviewed the information documented by a nurse practitioner and agree with the treatment plan.  Ronika Kelson,JANARDHAHA R. 11/02/2013 6:13 PM

## 2013-11-02 NOTE — Progress Notes (Signed)
Recreation Therapy Notes  nimal-Assisted Activity/Therapy (AAA/T) Program Checklist/Progress Notes Patient Eligibility Criteria Checklist & Daily Group note for Rec Tx Intervention  Date: 01.13.2015 Time: 2:45pm [address]   AAA/T Program Assumption of Risk Form signed by Patient/ or Parent Legal Guardian yes  Patient is free of allergies or sever asthma yes  Patient reports no fear of animals yes  Patient reports no history of cruelty to animals yes   Patient understands his/her participation is voluntary yes  Patient washes hands before animal contact yes  Patient washes hands after animal contact yes  Behavioral Response: Appropriate   Education: Hand Washing, Appropriate Animal Interaction   Education Outcome: Acknowledges understanding   Clinical Observations/Feedback: Patient interacted appropriately with therapy dog team, peers and LRT.   Marykay Lexenise L Luceal Hollibaugh, LRT/CTRS  Jearl KlinefelterBlanchfield, Kateria Cutrona L 11/02/2013 5:23 PM

## 2013-11-02 NOTE — Progress Notes (Signed)
D: Pt denies SI/HI/AVH. Pt is pleasant and cooperative. Pt stated she feels better she is back on her normal meds.   A: Pt was offered support and encouragement. Pt was given scheduled medications. Pt was encourage to attend groups. Q 15 minute checks were done for safety.   R:Pt attends groups and interacts well with peers and staff. Pt is taking medication. Pt has no complaints at this time.Pt receptive to treatment and safety maintained on unit.
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
## 2013-11-03 NOTE — Progress Notes (Signed)
Recreation Therapy Notes  Date: 01.14.2015 Time: 3:00 Location: 500 [address] Dayroom   Group Topic: Communication, Team Building, Problem Solving  Goal Area(s) Addresses:  Patient will effectively work with peer towards shared goal.  Patient will identify skill used to make activity successful.  Patient will identify how skills used during activity can be used to reach post d/c goals.   Behavioral Response: Engaged, Attentive, Appropriate   Intervention: Problem Solving Activitiy  Activity: Life Boat. Patients were given a scenario about being on a sinking yacht. Patients were informed the yacht included 15 guest, 8 of which could be placed on the life boat, along with all group members. Individuals on guest list were of varying socioeconomic classes such as a Education officer, museumriest, Materials engineerresident Obama, MidwifeBus Driver, Tree surgeonTeacher and Chef.   Education: Pharmacist, communityocial Skills, Discharge Planning   Education Outcome: Acknowledges understanding  Clinical Observations/Feedback: Patient actively engaged in group activity, voicing her opinion and debating with peers appropriately.   At approximately 3:20pm group session was ended due to group member showing signs of seizure like behavior.   Patient with peers approached LRT in hallway following group session and asked her to finish group, LRT complied with patient request. Patient identified communication and team work as skills used during group session, in addition to relating these skills to working within her support system post d/c.   Heather Preston, LRT/CTRS  Jearl KlinefelterBlanchfield, Mechell Girgis L 11/03/2013 4:56 PM

## 2013-11-03 NOTE — Progress Notes (Signed)
[facility] MD Progress Note  11/03/2013 12:36 PM Heather Preston  MRN:  [id number] Subjective:  Patient was seen and chart reviewed. Patient was admitted with a suicidal attempt after taking multiple medications. Patient reported she was diagnosed with the bipolar disorder attention deficit hyperactivity disorder and being noncompliant with her medication about one and a half months before the incident. Patient has been compliant with her medication and signed the 72 hours discharge request. Diagnosis:   DSM5: Schizophrenia Disorders:   Obsessive-Compulsive Disorders:   Trauma-Stressor Disorders:   Substance/Addictive Disorders:   Depressive Disorders:  Disruptive Mood Dysregulation Disorder (296.99)  Axis I: ADHD, combined type and Bipolar, mixed  ADL's:  Intact  Sleep: Fair  Appetite:  Fair  Suicidal Ideation:  Patient had suicide attempt with multiple medication overdose but contracts for safety while in hospital  Homicidal Ideation:  Denied  AEB (as evidenced by):  Psychiatric Specialty Exam: ROS  Blood pressure 104/78, pulse 107, temperature 98.3 F (36.8 C), temperature source Oral, resp. rate 16, height 5\' 4"  (1.626 m), weight 60.328 kg (133 lb).Body mass index is 22.82 kg/(m^2).  General Appearance: Guarded  Eye Contact::  Fair  Speech:  Clear and Coherent  Volume:  Decreased  Mood:  Angry, Anxious, Depressed, Hopeless and Worthless  Affect:  Depressed and Flat  Thought Process:  Goal Directed and Intact  Orientation:  Full (Time, Place, and Person)  Thought Content:  WDL  Suicidal Thoughts:  Yes.  with intent/plan  Homicidal Thoughts:  No  Memory:  Immediate;   Fair  Judgement:  Impaired  Insight:  Lacking  Psychomotor Activity:  Psychomotor Retardation  Concentration:  Fair  Recall:  Fair  Akathisia:  NA  Handed:  Right  AIMS (if indicated):     Assets:  Communication Skills Desire for Improvement Financial Resources/Insurance Housing Physical  Health Resilience Social Support Transportation  Sleep:  Number of Hours: 6.25   Current Medications: Current Facility-Administered Medications  Medication Dose Route Frequency Provider Last Rate Last Dose  . acetaminophen (TYLENOL) tablet 650 mg  650 mg Oral Q6H PRN Beau Fanny, FNP   650 mg at 11/02/13 1412  . alum & mag hydroxide-simeth (MAALOX/MYLANTA) 200-200-20 MG/5ML suspension 30 mL  30 mL Oral Q4H PRN Beau Fanny, FNP      . amphetamine-dextroamphetamine (ADDERALL) tablet 15 mg  15 mg Oral BID Beau Fanny, FNP   15 mg at 11/03/13 0830  . docusate sodium (COLACE) capsule 100 mg  100 mg Oral BID Beau Fanny, FNP   100 mg at 11/03/13 0830  . doxepin (SINEQUAN) capsule 100 mg  100 mg Oral QHS Beau Fanny, FNP   100 mg at 11/02/13 2137  . hydrOXYzine (ATARAX/VISTARIL) tablet 25 mg  25 mg Oral Q6H PRN Beau Fanny, FNP      . magnesium hydroxide (MILK OF MAGNESIA) suspension 30 mL  30 mL Oral Daily PRN Beau Fanny, FNP      . nicotine (NICODERM CQ - dosed in mg/24 hours) patch 21 mg  21 mg Transdermal Daily Beau Fanny, FNP   21 mg at 11/03/13 0831  . Oxcarbazepine (TRILEPTAL) tablet 900 mg  900 mg Oral QHS Beau Fanny, FNP   900 mg at 11/02/13 2137  . polyethylene glycol (MIRALAX / GLYCOLAX) packet 17 g  17 g Oral BID PRN Beau Fanny, FNP      . Vortioxetine HBr (BRINTELLIX) 20 MG tablet 20 mg  20 mg Oral Daily John  Chipper Herb Withrow, FNP   20 mg at 11/03/13 0830    Lab Results:  Results for orders placed during the hospital encounter of 11/01/13 (from the past 48 hour(s))  URINALYSIS, ROUTINE W REFLEX MICROSCOPIC     Status: Abnormal   Collection Time    11/02/13  1:39 PM      Result Value Range   Color, Urine YELLOW  YELLOW   APPearance CLOUDY (*) CLEAR   Specific Gravity, Urine 1.019  1.005 - 1.030   pH 6.0  5.0 - 8.0   Glucose, UA NEGATIVE  NEGATIVE mg/dL   Hgb urine dipstick NEGATIVE  NEGATIVE   Bilirubin Urine NEGATIVE  NEGATIVE   Ketones, ur NEGATIVE   NEGATIVE mg/dL   Protein, ur NEGATIVE  NEGATIVE mg/dL   Urobilinogen, UA 0.2  0.0 - 1.0 mg/dL   Nitrite POSITIVE (*) NEGATIVE   Leukocytes, UA NEGATIVE  NEGATIVE   Comment: Performed at [facility]  URINE MICROSCOPIC-ADD ON     Status: Abnormal   Collection Time    11/02/13  1:39 PM      Result Value Range   Squamous Epithelial / LPF RARE  RARE   WBC, UA 3-6  <3 WBC/hpf   Bacteria, UA MANY (*) RARE   Crystals CA OXALATE CRYSTALS (*) NEGATIVE   Urine-Other AMORPHOUS URATES/PHOSPHATES     Comment: [facility]    Physical Findings: AIMS: Facial and Oral Movements Muscles of Facial Expression: None, normal Lips and Perioral Area: None, normal Jaw: None, normal Tongue: None, normal,Extremity Movements Upper (arms, wrists, hands, fingers): None, normal Lower (legs, knees, ankles, toes): None, normal, Trunk Movements Neck, shoulders, hips: None, normal, Overall Severity Severity of abnormal movements (highest score from questions above): None, normal Incapacitation due to abnormal movements: None, normal Patient's awareness of abnormal movements (rate only patient's report): No Awareness, Dental Status Current problems with teeth and/or dentures?: No Does patient usually wear dentures?: No  CIWA:    COWS:     Treatment Plan Summary: Daily contact with patient to assess and evaluate symptoms and progress in treatment Medication management  Plan: Treatment Plan/Recommendations:   1. Admit for crisis management and stabilization. 2. Medication management to reduce current symptoms to base line and improve the patient's overall level of functioning. Continue her current medications and no changes made today. Encouraged to be compliant with medications and therapies 3. Treat health problems as indicated. 4. Develop treatment plan to decrease risk of relapse upon discharge and to reduce the need for readmission. 5. Psycho-social  education regarding relapse prevention and self care. 6. Health care follow up as needed for medical problems. 7. Restart home medications where appropriate. 8. Disposition plans are in progress may be discharged tomorrow if she continues to contract for safety, if not she needed involuntary commitment.   Medical Decision Making Problem Points:  Established problem, worsening (2), New problem, with no additional work-up planned (3), Review of last therapy session (1) and Review of psycho-social stressors (1) Data Points:  Review or order clinical lab tests (1) Review or order medicine tests (1) Review of medication regiment & side effects (2) Review of new medications or change in dosage (2)  I certify that inpatient services furnished can reasonably be expected to improve the patient's condition.   Lilian Fuhs,JANARDHAHA R. 11/03/2013, 12:36 PM

## 2013-11-03 NOTE — [facility] Group Notes (Signed)
[facility]   11/03/2013 1:15 PM    Participation Quality:  Appropraite  Mood/Affect:  Appropriate  Depression Rating:  1  Anxiety Rating:  5  Thoughts of Suicide:  No  Will you contract for safety?   NA  Current AVH:  No  Plan for Discharge/Comments:  Patient attended discharge planning group and actively participated in group.  She reports doing much better and hopes to discharge home soon.  Patient advised she will follow up with Dr. Jennelle Humanottle for outpatient services.CSW provided all participants with daily workbook.   Transportation Means: Patient has transportation.   Supports:  Patient has a support system.   Josiah Nieto, Joesph JulyQuylle Hairston

## 2013-11-03 NOTE — Tx Team (Signed)
Interdisciplinary Treatment Plan Update   Date Reviewed:  11/03/2013  Time Reviewed:  8:39 AM  Progress in Treatment:   Attending groups: Yes Participating in groups: Yes Taking medication as prescribed: Yes  Tolerating medication: Yes Family/Significant other contact made: Yes,contact made with father Patient understands diagnosis: Yes  Discussing patient identified problems/goals with staff: Yes Medical problems stabilized or resolved: Yes Denies suicidal/homicidal ideation: Yes Patient has not harmed self or others: Yes  For review of initial/current patient goals, please see plan of care.  Estimated Length of Stay:  1 day  Reasons for Continued Hospitalization:  Anxiety Depression Medication stabilization   New Problems/Goals identified:    Discharge Plan or Barriers:   Home with outpatient follow up to be determined  Additional Comments:    Denzil MagnusonKrista Purdon is a 33 y.o. female with a history of major depression who presents with overdose. She staes that she has been very depressed for several reasons- she has recently been laid off from her job and her best friend passed away recently. She states that she took about seroquel 6, trileptal 6-10, topamax 5-7, doxepin 5-6, and excedrin migraine as well as a bottle of wine in an attempt to "numb her pain". She denies overt suicidal intention, buts says that she didn't really care whether she woke up again  Attendees:  Patient:  11/03/2013 8:39 AM   Signature: Mervyn GayJ. Jonnalagadda, MD 11/03/2013 8:39 AM  Signature: Claudette Headonrad Withrow, NP  11/03/2013 8:39 AM  Signature:  11/03/2013 8:39 AM  Signature: 11/03/2013 8:39 AM  Signature:  11/03/2013 8:39 AM  Signature:  Juline PatchQuylle Traver Meckes, LCSW 11/03/2013 8:39 AM  Signature:  Reyes Ivanhelsea Horton, LCSW 11/03/2013 8:39 AM  Signature:  Leisa LenzValerie Enoch, Care Coordinator 11/03/2013 8:39 AM  Signature:  Aloha GellKrista Dopson, RN 11/03/2013 8:39 AM  Signature:  11/03/2013  8:39 AM  Signature:   Onnie BoerJennifer Clark, RN [facility] 11/03/2013   8:39 AM  Signature:   11/03/2013  8:39 AM    Scribe for Treatment Team:   Juline PatchQuylle Sanya Kobrin,  11/03/2013 8:39 AM

## 2013-11-03 NOTE — Progress Notes (Signed)
Patient ID: Denzil MagnusonKrista Cowper, female   DOB: 02/19/1981, 33 y.o.   MRN: [id number]  D: Pt. Denies SI/HI and A/V Hallucinations. Patient does not report any pain or discomfort at this time.  Patient rates her depression at 2/10 and her hopelessness at 1/10 for the day. Patient reports that when she goes home she will, "take all my meds." Patient requested to go home today. Writer spoke to patient about not D/C today and patient seemed okay with this.  A: Support and encouragement provided to the patient. Scheduled medications given to patient per physician's orders.  R: Patient is receptive and cooperative but minimal today. Patient is seen in the milieu frequently and going to groups. Q15 minute checks are maintained for safety.

## 2013-11-03 NOTE — Progress Notes (Signed)
Patient ID: Heather MagnusonKrista Weinmann, female   DOB: 05/11/1981, 33 y.o.   MRN: [id number] D: pt. Animated, visible on the unit, joking and laughing in dayroom, exuberant behaviors, loud and impulsive. A: Writer reviewed medications, encouraged group and calm behavior. R: Staff will monitor q7415min for safety. R: Pt. Is safe on the unit.

## 2013-11-03 NOTE — BHH Group Notes (Signed)
BHH LCSW Group Therapy  Emotional Regulation 1:15 - 2: 30 PM        11/03/2013  1:16 PM   Type of Therapy:  Group Therapy  Participation Level:  Minimal  Participation Quality:  Appropriate  Affect:  Appropriate  Cognitive:  Attentive Appropriate  Insight:  Developing/Improving  Engagement in Therapy:  Developing/Improving  Modes of Intervention:  Discussion Exploration Problem-Solving Supportive  Summary of Progress/Problems:  Group topic was emotional regulations.  Patient listened attentively to discussion but advised of not wanting to participate in the discussion.  Wynn BankerHodnett, Wiliam Cauthorn Hairston 11/03/2013 1:16 PM

## 2013-11-03 NOTE — BHH Group Notes (Signed)
Adult Psychoeducational Group Note  Date:  11/03/2013 Time:  10:53 PM  Group Topic/Focus:  Wrap-Up Group:   The focus of this group is to help patients review their daily goal of treatment and discuss progress on daily workbooks.  Participation Level:  Active  Participation Quality:  Appropriate  Affect:  Appropriate  Cognitive:  Appropriate  Insight: Appropriate  Engagement in Group:  Engaged  Modes of Intervention:  Discussion  Additional Comments:  Dot LanesKrista stated her day was pretty good.  She said she had anxiety about leaving because of her bills and she is the only one responsible for them.  She also said when her insurance went up and her best friend died, she became depressed.  She is getting discharged tomorrow and is looking forward to seeing her dogs.  Caroll RancherLindsay, Kirt Chew A 11/03/2013, 10:53 PM

## 2013-11-04 DIAGNOSIS — F332 Major depressive disorder, recurrent severe without psychotic features: Secondary | ICD-10-CM

## 2013-11-04 DIAGNOSIS — F411 Generalized anxiety disorder: Secondary | ICD-10-CM

## 2013-11-04 MED ORDER — CIPROFLOXACIN HCL 500 MG PO TABS
500.0000 mg | ORAL_TABLET | ORAL | Status: AC
  Administered 2013-11-04: 500 mg via ORAL
  Filled 2013-11-04: qty 1
  Filled 2013-11-04: qty 2

## 2013-11-04 MED ORDER — NICOTINE 21 MG/24HR TD PT24: 21.0000 mg | MEDICATED_PATCH | Freq: Every day | TRANSDERMAL | Status: DC

## 2013-11-04 MED ORDER — DOXEPIN HCL 100 MG PO CAPS: 100.0000 mg | ORAL_CAPSULE | Freq: Every day | ORAL | Status: DC

## 2013-11-04 MED ORDER — AMPHETAMINE-DEXTROAMPHETAMINE 15 MG PO TABS: 15.0000 mg | ORAL_TABLET | Freq: Two times a day (BID) | ORAL | Status: DC

## 2013-11-04 MED ORDER — IBUPROFEN 800 MG PO TABS
800.0000 mg | ORAL_TABLET | Freq: Once | ORAL | Status: AC
  Administered 2013-11-04: 800 mg via ORAL
  Filled 2013-11-04 (×2): qty 1

## 2013-11-04 MED ORDER — OXCARBAZEPINE 300 MG PO TABS: 900.0000 mg | ORAL_TABLET | Freq: Every day | ORAL | Status: DC

## 2013-11-04 MED ORDER — VORTIOXETINE HBR 20 MG PO TABS: 20.0000 mg | ORAL_TABLET | Freq: Every day | ORAL | Status: DC

## 2013-11-04 MED ORDER — HYDROXYZINE HCL 25 MG PO TABS: 25.0000 mg | ORAL_TABLET | Freq: Four times a day (QID) | ORAL | Status: DC | PRN

## 2013-11-04 MED ORDER — CIPROFLOXACIN HCL 500 MG PO TABS: 500.0000 mg | ORAL_TABLET | Freq: Two times a day (BID) | ORAL | Status: DC

## 2013-11-04 MED ORDER — CIPROFLOXACIN HCL 500 MG PO TABS
500.0000 mg | ORAL_TABLET | Freq: Two times a day (BID) | ORAL | Status: DC
  Filled 2013-11-04 (×2): qty 20

## 2013-11-04 NOTE — Progress Notes (Signed)
[facility] :  Will you be returning to the same living situation after discharge: Yes,  Patient is returning to her home. At discharge, do you have transportation home?:Yes,  Patient is arranging transporation home. Do you have the ability to pay for your medications:Yes,  Patient is able to obtain medications.  Release of information consent forms completed and in the chart;  Patient's signature needed at discharge.  Patient to Follow up at: Follow-up Information   Follow up with Dr. Jennelle Humanottle - [facility] On 11/12/2013. (Friday, January 23, 201t at 10:15 AM)    Contact information:   [address] [place], [place]   [id number]  [phone]      Follow up with Christain [facility] Lessard On 11/05/2013. (Friday, November 05, 2013 at 10AM)    Contact information:   [address] [place], [place]   [id number]  [phone]      Follow up with [facility]. (Patient decided against treatment with this agency.  Do not release information.)       Patient denies SI/HI:   Patient no longer endorsing SI/HI or other thoughts of self harm.  Safety Planning and Suicide Prevention discussed:  .Reviewed with all patients during discharge planning group   Heather Preston, Heather Preston 11/04/2013, 2:23 PM

## 2013-11-04 NOTE — Discharge Summary (Signed)
Physician Discharge Summary Note  Patient:  Heather Preston is an 33 y.o., female MRN:  [id number] DOB:  06/22/1981 Patient phone:  [phone] (home)  Patient address:   [address] [place] [address],   Date of Admission:  11/01/2013 Date of Discharge: 11/04/2013  Reason for Admission:  Suicidal ideation w/plan and attempt (OD)  Discharge Diagnoses: Active Problems:   Major depression, recurrent   Bipolar I disorder, most recent episode mixed   Attention deficit disorder with hyperactivity(314.01)  Review of Systems  Constitutional: Negative.   HENT: Negative.   Eyes: Negative.   Respiratory: Negative.   Cardiovascular: Negative.   Gastrointestinal: Negative.   Genitourinary: Negative.   Musculoskeletal: Negative.   Skin: Negative.   Neurological: Negative.   Endo/Heme/Allergies: Negative.   Psychiatric/Behavioral: Positive for depression (2/10). The patient is nervous/anxious (6/10).     DSM5: Depressive Disorders:  Major Depressive Disorder - Severe (296.23)  Axis Diagnosis:   AXIS I:  Anxiety Disorder NOS and Major Depression, Recurrent severe AXIS II:  Deferred AXIS III:   Past Medical History  Diagnosis Date  . Kidney infection   . UTI (lower urinary tract infection)   . ADD (attention deficit disorder)   . Anxiety   . Depression    AXIS IV:  other psychosocial or environmental problems and problems related to social environment AXIS V:  61-70 mild symptoms  Level of Care:  OP  Hospital Course:   Heather Preston is a 33 y.o. female with a history of major depression who presents with overdose. She staes that she has been very depressed for several reasons- she has recently been laid off from her job and her best friend passed away recently. She states that she took about seroquel 6, trileptal 6-10, topamax 5-7, doxepin 5-6, and excedrin migraine as well as a bottle of wine in an attempt to "numb her pain". She denies overt suicidal intention, buts  says that she didn't really care whether she woke up again. She then called her parents who brought her to the [facility] ED and then was transferred to [facility]. On presentation she was groggy but able to protect airway. EKG with QT prolongation and tachycardia, hypokalemia at 3.0. UDS negative. ETOH positive. Acetaminophen and salicylate levels negative.   As of 11/02/2013, pt denies SI, HI, and AVH. Pt states that she is concerned about her medications from [facility] and wants to make sure they are correct, stating that "it has taken a long time with many providers to find a balance and I finally found it, I don't feel like myself right now and I just want to get back on my meds if you can verify them" (verified from [facility], updated to current). Pt rates anxiety at 10/10 and depression at 7/10, but states that most of the anxiety is actually from being in here and not being able to work and pay her bills right now. Pt states that she needs to "contact her landlord, who is a friend" so that she can let her know the situation and not get kicked out. She states that he number is in her cell phone so she does not have it on her (nursing staff notified).   During Hospitalization: Medications managed, psychoeducation, group and individual therapy. Pt currently denies SI, HI, and Psychosis. At discharge, pt rates anxiety at 6/10 and depression at 2/10. Pt states that she does have a good supportive home environment and will followup with outpatient treatment. Pt  does report some burning with urination, UA shows evidence of complicated UTI, Cipro 500mg  PO bid started. Affirms agreement with medication regimen and discharge plan. Denies other physical and psychological concerns at time of discharge.   Consults:  None  Significant Diagnostic Studies:  labs: Pt UA shows evidence of UTI.  Discharge Vitals:   Blood pressure 110/78, pulse 92, temperature 98.1 F (36.7 C), temperature source Oral,  resp. rate 16, height 5\' 4"  (1.626 m), weight 60.328 kg (133 lb). Body mass index is 22.82 kg/(m^2). Lab Results:   Results for orders placed during the hospital encounter of 11/01/13 (from the past 72 hour(s))  URINALYSIS, ROUTINE W REFLEX MICROSCOPIC     Status: Abnormal   Collection Time    11/02/13  1:39 PM      Result Value Range   Color, Urine YELLOW  YELLOW   APPearance CLOUDY (*) CLEAR   Specific Gravity, Urine 1.019  1.005 - 1.030   pH 6.0  5.0 - 8.0   Glucose, UA NEGATIVE  NEGATIVE mg/dL   Hgb urine dipstick NEGATIVE  NEGATIVE   Bilirubin Urine NEGATIVE  NEGATIVE   Ketones, ur NEGATIVE  NEGATIVE mg/dL   Protein, ur NEGATIVE  NEGATIVE mg/dL   Urobilinogen, UA 0.2  0.0 - 1.0 mg/dL   Nitrite POSITIVE (*) NEGATIVE   Leukocytes, UA NEGATIVE  NEGATIVE   Comment: [facility]  URINE MICROSCOPIC-ADD ON     Status: Abnormal   Collection Time    11/02/13  1:39 PM      Result Value Range   Squamous Epithelial / LPF RARE  RARE   WBC, UA 3-6  <3 WBC/hpf   Bacteria, UA MANY (*) RARE   Crystals CA OXALATE CRYSTALS (*) NEGATIVE   Urine-Other AMORPHOUS URATES/PHOSPHATES     Comment: Performed at [facility]  URINE CULTURE     Status: None   Collection Time    11/02/13  1:39 PM      Result Value Range   Specimen Description       Value: URINE, CLEAN CATCH     Performed at [facility]   Special Requests       Value: NONE     Performed at [facility]   Culture  Setup Time       Value: 11/03/2013 00:51     Performed at [facility]   Colony Count       Value: >=100,000 COLONIES/ML     Performed at [facility]   Culture       Value: ESCHERICHIA COLI     Performed at [facility]   Report Status PENDING      Physical Findings: AIMS: Facial and Oral Movements Muscles of Facial Expression: None, normal Lips and Perioral Area: None, normal Jaw: None,  normal Tongue: None, normal,Extremity Movements Upper (arms, wrists, hands, fingers): None, normal Lower (legs, knees, ankles, toes): None, normal, Trunk Movements Neck, shoulders, hips: None, normal, Overall Severity Severity of abnormal movements (highest score from questions above): None, normal Incapacitation due to abnormal movements: None, normal Patient's awareness of abnormal movements (rate only patient's report): No Awareness, Dental Status Current problems with teeth and/or dentures?: No Does patient usually wear dentures?: No  CIWA:  CIWA-Ar Total: 2 COWS:  COWS Total Score: 3  Psychiatric Specialty Exam: See Psychiatric Specialty Exam and Suicide Risk Assessment completed by Attending Physician prior to discharge.  Discharge destination:  Home  Is patient on multiple antipsychotic therapies at discharge:  No   Has Patient had three or more failed trials of antipsychotic monotherapy by history:  No  Recommended Plan for Multiple Antipsychotic Therapies: NA     Medication List       Indication   amphetamine-dextroamphetamine 15 MG tablet  Commonly known as:  ADDERALL  Take 1 tablet (15 mg total) by mouth 2 (two) times daily.   Indication:  Attention Deficit Disorder     doxepin 100 MG capsule  Commonly known as:  SINEQUAN  Take 1 capsule (100 mg total) by mouth at bedtime.   Indication:  sleep issues     nicotine 21 mg/24hr patch  Commonly known as:  NICODERM CQ - dosed in mg/24 hours  Place 1 patch (21 mg total) onto the skin daily.      Oxcarbazepine 300 MG tablet  Commonly known as:  TRILEPTAL  Take 3 tablets (900 mg total) by mouth at bedtime.   Indication:  mood stabilization     Vortioxetine HBr 20 MG Tabs  Commonly known as:  BRINTELLIX  Take 20 mg by mouth daily.   Indication:  mood stabiliztion           Follow-up Information   Follow up with Dr. Jennelle Human [facility] On 11/12/2013. (Friday, January 23, 201t at 10:15 AM)     Contact information:   [address], [place]   [address]  [phone]      Follow up with Christain Sacramento On 11/05/2013. (Friday, November 05, 2013 at [facility])    Contact information:   [address] [place], [place]   [address]  [phone]      Follow-up recommendations:  Activity:  As tolerated Diet:  Heart healthy with low sodium.  Comments:   Take all medications as prescribed. Start Cipro 500mg  PO bid x 10 days for Complicated Urinary Tract Infection. Keep all follow-up appointments as scheduled.  Do not consume alcohol or use illegal drugs while on prescription medications. Report any adverse effects from your medications to your primary care provider promptly.  In the event of recurrent symptoms or worsening symptoms, call 911, a crisis hotline, or go to the nearest emergency department for evaluation.   Total Discharge Time:  Greater than 30 minutes.  Signed: Beau Fanny, FNP-BC 11/04/2013, 10:45 AM  Patient was seen face-to-face psychiatric evaluation and suicide risk assessment. Case was discussed with the physician extender and formulated the treatment plan. Reviewed the information documented by physician extender and agree with the discharged plan.  Derrien Anschutz,JANARDHAHA R. 11/05/2013 6:39 PM

## 2013-11-04 NOTE — [facility] Suicide Risk Assessment (Signed)
Suicide Risk Assessment  Discharge Assessment     Demographic Factors:  Adolescent or young adult, Caucasian, Low socioeconomic status and Unemployed  Mental Status Per Nursing Assessment::   On Admission:     Current Mental Status by Physician: Patient was calm and cooperative. Patient has a normal psychomotor activity. Patient has a good mood and appropriate affect. Patient has normal speech and thought process. Patient has no reported suicidal or homicidal ideations, intentions or plan. Patient has no evidence of psychotic symptoms  Loss Factors: Financial problems/change in socioeconomic status  Historical Factors: Prior suicide attempts, Family history of mental illness or substance abuse and Impulsivity  Risk Reduction Factors:   Sense of responsibility to family, Religious beliefs about death, Living with another person, especially a relative, Positive social support, Positive therapeutic relationship and Positive coping skills or problem solving skills  Continued Clinical Symptoms:  Bipolar Disorder:   Mixed State Depression:   Recent sense of peace/wellbeing Previous Psychiatric Diagnoses and Treatments Medical Diagnoses and Treatments/Surgeries  Cognitive Features That Contribute To Risk:  Polarized thinking    Suicide Risk:  Minimal: No identifiable suicidal ideation.  Patients presenting with no risk factors but with morbid ruminations; may be classified as minimal risk based on the severity of the depressive symptoms  Discharge Diagnoses:   AXIS I:  ADHD, combined type and Bipolar, mixed AXIS II:  Deferred AXIS III:   Past Medical History  Diagnosis Date  . Kidney infection   . UTI (lower urinary tract infection)   . ADD (attention deficit disorder)   . Anxiety   . Depression    AXIS IV:  economic problems, occupational problems, other psychosocial or environmental problems, problems related to social environment and problems with primary support  group AXIS V:  61-70 mild symptoms  Plan Of Care/Follow-up recommendations:  Activity:  As tolerated Diet:  Regular  Is patient on multiple antipsychotic therapies at discharge:  No   Has Patient had three or more failed trials of antipsychotic monotherapy by history:  No  Recommended Plan for Multiple Antipsychotic Therapies: NA  Abdelaziz Westenberger,JANARDHAHA R. 11/04/2013, 11:39 AM

## 2013-11-04 NOTE — Progress Notes (Signed)
The focus of this group is to educate the patient on the purpose and policies of crisis stabilization and provide a format to answer questions about their admission.  The group details unit policies and expectations of patients while admitted.  Patient attended 0900 nurse education orientation group this morning.  Patient listened, appropriate affect, alert, appropriate insight and engagement.  Today patient will work on 3 goals for discharge.  

## 2013-11-04 NOTE — Progress Notes (Signed)
Discharge Note:  Patient discharged home.  Denied SI and HI.  Denied A/V hallucinations.  Denied pain.  Suicide prevention information given and discussed with patient.  Patient received all her belongings, clothing, prescriptions, medications, etc.  Patient stated she [facility].

## 2013-11-04 NOTE — Progress Notes (Signed)
D:  Patient's self inventory sheet, patient sleeps well, good appetite, hyper, improving attention span.  Rated depression and hopeless 1, anxiety 5.  Denied withdrawals.  Deneid SI.  Had headache in past 24 hours.  Plans not to isolate after discharge.  Will discuss UTI results and constipation medications with MD before discharge.  Needs samples of medications.  Has $200 deductible to meet for medications/MD. A:  Medications administered per MD orders.  Emotional support and encouragement given patient. R:  Denied SI and HI.  Denied A/V hallucinations.  Denied pain.   Patient stated she needs new medications for UTI and constipations.  Patient stated she lost her job and must meet $200 deductible before she can purchase her medications, etc.  Patient has refused prune juice and MOM this moring.  Will discuss with MD.  Stated she is concerned about her bills after her discharge.

## 2013-11-04 NOTE — Progress Notes (Signed)
Adult Psychoeducational Group Note  Date:  11/04/2013 Time:  5:05 PM  Group Topic/Focus:  Rediscovering Joy:   The focus of this group is to explore various ways to relieve stress in a positive manner and how to rediscover joy and laughter in their life.  Participation Level:  Active  Participation Quality:  Appropriate  Affect:  Appropriate  Cognitive:  Appropriate  Insight: Appropriate  Engagement in Group:  Engaged  Modes of Intervention:  Discussion and Support  Additional Comments:  Pts discussed things that bring them joy and make them laugh. Pt stated she needs to get back to work to find her joy. Pt states she is a people person and really lost her identity when she stopped working and needs to socialize more to bring her joy back.  Caswell CorwinOwen, Amey Hossain C 11/04/2013, 5:05 PM

## 2013-11-04 NOTE — Progress Notes (Signed)
Recreation Therapy Notes  Animal-Assisted Activity/Therapy (AAA/T) Program Checklist/Progress Notes Patient Eligibility Criteria Checklist & Daily Group note for Rec Tx Intervention  Date: 01.15.2015 Time: 2:45pm Location: 500 hall dayroom    AAA/T Program Assumption of Risk Form signed by Patient/ or Parent Legal Guardian yes  Patient is free of allergies or sever asthma yes  Patient reports no fear of animals yes  Patient reports no history of cruelty to animals yes   Patient understands his/her participation is voluntary yes  Behavioral Response: Did not attend.   Marykay Lexenise L Jancy Sprankle, LRT/CTRS  Jearl KlinefelterBlanchfield, Vaughan Garfinkle L 11/04/2013 4:35 PM

## 2013-11-04 NOTE — [facility] Group Notes (Signed)
[facility] LCSW Group Therapy  11/04/2013 1:15 PM   Type of Therapy:  Group Therapy  Participation Level:  Did Not Attend - pt was sleeping in their room  Altamese Deguire Horton, LCSW 11/04/2013 2:36 PM   

## 2013-11-05 LAB — URINE CULTURE: Colony Count: >=100000

## 2013-11-08 ENCOUNTER — Telehealth (HOSPITAL_BASED_OUTPATIENT_CLINIC_OR_DEPARTMENT_OTHER): Payer: Self-pay

## 2013-11-10 NOTE — Progress Notes (Addendum)
Patient Discharge Instructions:  After Visit Summary (AVS):   Faxed to:  11/10/13 Discharge Summary Note:   Faxed to:  11/10/13 Psychiatric Admission Assessment Note:   Faxed to:  11/10/13 Suicide Risk Assessment - Discharge Assessment:   Faxed to:  11/10/13 Faxed/Sent to the Next Level Care provider:  11/10/13 [facility]axed to [facility] @ [phone] Records sent via mail to: [facility] [address] [place], [place] [id number] No documentation was faxed to the [facility] for [facility]IPS.  Per the SW the patient decided against treatment at this facility.  Jerelene ReddenSheena E [place], 11/10/2013, 3:56 PM

## 2013-12-24 ENCOUNTER — Emergency Department (HOSPITAL_BASED_OUTPATIENT_CLINIC_OR_DEPARTMENT_OTHER)
Admission: EM | Admit: 2013-12-24 | Discharge: 2013-12-24 | Disposition: A | Discharge status: Home / Self Care | Payer: BC Managed Care – PPO | Attending: Emergency Medicine | Admitting: Emergency Medicine

## 2013-12-24 ENCOUNTER — Encounter (HOSPITAL_BASED_OUTPATIENT_CLINIC_OR_DEPARTMENT_OTHER): Payer: Self-pay | Admitting: Emergency Medicine

## 2013-12-24 ENCOUNTER — Emergency Department (HOSPITAL_BASED_OUTPATIENT_CLINIC_OR_DEPARTMENT_OTHER): Payer: BC Managed Care – PPO

## 2013-12-24 DIAGNOSIS — M545 Low back pain, unspecified: Secondary | ICD-10-CM | POA: Insufficient documentation

## 2013-12-24 DIAGNOSIS — Z87448 Personal history of other diseases of urinary system: Secondary | ICD-10-CM | POA: Insufficient documentation

## 2013-12-24 DIAGNOSIS — F17200 Nicotine dependence, unspecified, uncomplicated: Secondary | ICD-10-CM | POA: Insufficient documentation

## 2013-12-24 DIAGNOSIS — R509 Fever, unspecified: Secondary | ICD-10-CM | POA: Insufficient documentation

## 2013-12-24 DIAGNOSIS — F329 Major depressive disorder, single episode, unspecified: Secondary | ICD-10-CM | POA: Insufficient documentation

## 2013-12-24 DIAGNOSIS — R109 Unspecified abdominal pain: Secondary | ICD-10-CM | POA: Insufficient documentation

## 2013-12-24 DIAGNOSIS — F988 Other specified behavioral and emotional disorders with onset usually occurring in childhood and adolescence: Secondary | ICD-10-CM | POA: Insufficient documentation

## 2013-12-24 DIAGNOSIS — R300 Dysuria: Secondary | ICD-10-CM | POA: Insufficient documentation

## 2013-12-24 DIAGNOSIS — R0602 Shortness of breath: Secondary | ICD-10-CM | POA: Insufficient documentation

## 2013-12-24 DIAGNOSIS — Z3202 Encounter for pregnancy test, result negative: Secondary | ICD-10-CM | POA: Insufficient documentation

## 2013-12-24 DIAGNOSIS — R110 Nausea: Secondary | ICD-10-CM | POA: Insufficient documentation

## 2013-12-24 DIAGNOSIS — Z792 Long term (current) use of antibiotics: Secondary | ICD-10-CM | POA: Insufficient documentation

## 2013-12-24 DIAGNOSIS — F3289 Other specified depressive episodes: Secondary | ICD-10-CM | POA: Insufficient documentation

## 2013-12-24 DIAGNOSIS — R000 Tachycardia, unspecified: Secondary | ICD-10-CM | POA: Insufficient documentation

## 2013-12-24 DIAGNOSIS — F411 Generalized anxiety disorder: Secondary | ICD-10-CM | POA: Insufficient documentation

## 2013-12-24 DIAGNOSIS — Z87440 Personal history of urinary (tract) infections: Secondary | ICD-10-CM | POA: Insufficient documentation

## 2013-12-24 DIAGNOSIS — Z79899 Other long term (current) drug therapy: Secondary | ICD-10-CM | POA: Insufficient documentation

## 2013-12-24 DIAGNOSIS — R0682 Tachypnea, not elsewhere classified: Secondary | ICD-10-CM | POA: Insufficient documentation

## 2013-12-24 LAB — CBC WITH DIFFERENTIAL/PLATELET
BASOS ABS: 0 10*3/uL (ref 0.0–0.1)
Basophils Relative: 0 % (ref 0–1)
Eosinophils Absolute: 0.1 10*3/uL (ref 0.0–0.7)
Eosinophils Relative: 1 % (ref 0–5)
HEMATOCRIT: 37.4 % (ref 36.0–46.0)
HEMOGLOBIN: 13 g/dL (ref 12.0–15.0)
LYMPHS ABS: 3.2 10*3/uL (ref 0.7–4.0)
LYMPHS PCT: 42 % (ref 12–46)
MCH: 31.3 pg (ref 26.0–34.0)
MCHC: 34.8 g/dL (ref 30.0–36.0)
MCV: 89.9 fL (ref 78.0–100.0)
MONO ABS: 0.4 10*3/uL (ref 0.1–1.0)
Monocytes Relative: 5 % (ref 3–12)
NEUTROS ABS: 4 10*3/uL (ref 1.7–7.7)
Neutrophils Relative %: 52 % (ref 43–77)
Platelets: 369 10*3/uL (ref 150–400)
RBC: 4.16 MIL/uL (ref 3.87–5.11)
RDW: 12.2 % (ref 11.5–15.5)
WBC: 7.6 10*3/uL (ref 4.0–10.5)

## 2013-12-24 LAB — URINALYSIS, ROUTINE W REFLEX MICROSCOPIC
Bilirubin Urine: NEGATIVE
Glucose, UA: NEGATIVE mg/dL
Ketones, ur: NEGATIVE mg/dL
NITRITE: NEGATIVE
PROTEIN: NEGATIVE mg/dL
SPECIFIC GRAVITY, URINE: 1.01 (ref 1.005–1.030)
UROBILINOGEN UA: 1 mg/dL (ref 0.0–1.0)
pH: 6 (ref 5.0–8.0)

## 2013-12-24 LAB — D-DIMER, QUANTITATIVE: D-Dimer, Quant: <0.27 ug/mL-FEU (ref 0.00–0.48)

## 2013-12-24 LAB — BASIC METABOLIC PANEL
BUN: 4 mg/dL — AB (ref 6–23)
CALCIUM: 9.5 mg/dL (ref 8.4–10.5)
CHLORIDE: 101 meq/L (ref 96–112)
CO2: 24 meq/L (ref 19–32)
Creatinine, Ser: 0.7 mg/dL (ref 0.50–1.10)
Glucose, Bld: 104 mg/dL — ABNORMAL HIGH (ref 70–99)
Potassium: 3.4 mEq/L — ABNORMAL LOW (ref 3.7–5.3)
Sodium: 141 mEq/L (ref 137–147)

## 2013-12-24 LAB — URINE MICROSCOPIC-ADD ON

## 2013-12-24 LAB — PREGNANCY, URINE: Preg Test, Ur: NEGATIVE

## 2013-12-24 MED ORDER — CYCLOBENZAPRINE HCL 10 MG PO TABS: 10.0000 mg | ORAL_TABLET | Freq: Two times a day (BID) | ORAL | Status: DC | PRN

## 2013-12-24 MED ORDER — SODIUM CHLORIDE 0.9 % IV BOLUS (SEPSIS)
1000.0000 mL | Freq: Once | INTRAVENOUS | Status: AC
  Administered 2013-12-24: 1000 mL via INTRAVENOUS

## 2013-12-24 MED ORDER — IBUPROFEN 800 MG PO TABS: 800.0000 mg | ORAL_TABLET | Freq: Three times a day (TID) | ORAL | Status: DC

## 2013-12-24 MED ORDER — ONDANSETRON 8 MG PO TBDP
8.0000 mg | ORAL_TABLET | Freq: Once | ORAL | Status: AC
  Administered 2013-12-24: 8 mg via ORAL

## 2013-12-24 MED ORDER — ONDANSETRON 8 MG PO TBDP
ORAL_TABLET | ORAL | Status: AC
  Filled 2013-12-24: qty 1

## 2013-12-24 MED ORDER — ONDANSETRON HCL 4 MG/2ML IJ SOLN
4.0000 mg | Freq: Once | INTRAMUSCULAR | Status: AC
  Administered 2013-12-24: 4 mg via INTRAVENOUS
  Filled 2013-12-24: qty 2

## 2013-12-24 MED ORDER — KETOROLAC TROMETHAMINE 30 MG/ML IJ SOLN
30.0000 mg | Freq: Once | INTRAMUSCULAR | Status: AC
  Administered 2013-12-24: 30 mg via INTRAVENOUS
  Filled 2013-12-24: qty 2

## 2013-12-24 NOTE — ED Provider Notes (Signed)
CSN: [id number]     Arrival date & time 12/24/13  1738 History  This chart was scribed for non-physician practitioner, Garfield CorneaFrances Sandford, PA-C working with Merrie RoofJohn David Wofford III, * by Luisa DagoPriscilla Tutu, ED scribe. This patient was seen in room MH10/MH10 and the patient's care was started at 6:17 PM.    Chief Complaint  Patient presents with  . Back Pain     The history is provided by the patient. No language interpreter was used.   HPI Comments: Heather Preston is a 33 y.o. female who presents to the Emergency Department complaining of gradual onset, worsening right lower back pain that started 3 days ago. Pt states that the pain has gone to the point where she cannot tolerate it anymore.  Pt describes the pain as shooting and stabbing. She is also complaining of associated SOB, nausea, dysuria, chills, subjective fever. Pt is currently on her her menstrual cycle. She currently takes doxepin Adderral, trileptal, and xanax daily. Denies any abdominal pain, chest pain, swollen joints, or constipation. Pt denies any prior injury or similar episodes.     Past Medical History  Diagnosis Date  . Kidney infection   . UTI (lower urinary tract infection)   . ADD (attention deficit disorder)   . Anxiety   . Depression    Past Surgical History  Procedure Laterality Date  . Urethra surgery     No family history on file. History  Substance Use Topics  . Smoking status: Current Every Day Smoker -- 1.00 packs/day    Types: Cigarettes  . Smokeless tobacco: Not on file  . Alcohol Use: Yes   OB History   Grav Para Term Preterm Abortions TAB SAB Ect Mult Living                 Review of Systems  All other systems reviewed and are negative.      Allergies  Review of patient's allergies indicates no known allergies.  Home Medications   Current Outpatient Rx  Name  Route  Sig  Dispense  Refill  . ALPRAZolam (XANAX) 1 MG tablet   Oral   Take 1 mg by mouth 2 (two) times daily.         .  Vortioxetine HBr (BRINTELLIX) 20 MG TABS   Oral   Take 20 mg by mouth daily.         Marland Kitchen. amphetamine-dextroamphetamine (ADDERALL) 15 MG tablet   Oral   Take 1 tablet (15 mg total) by mouth 2 (two) times daily.   14 tablet   0   . ciprofloxacin (CIPRO) 500 MG tablet   Oral   Take 1 tablet (500 mg total) by mouth 2 (two) times daily.   20 tablet   0   . doxepin (SINEQUAN) 100 MG capsule   Oral   Take 1 capsule (100 mg total) by mouth at bedtime.   30 capsule   0   . hydrOXYzine (ATARAX/VISTARIL) 25 MG tablet   Oral   Take 1 tablet (25 mg total) by mouth every 6 (six) hours as needed for anxiety.   30 tablet   0   . nicotine (NICODERM CQ - DOSED IN MG/24 HOURS) 21 mg/24hr patch   Transdermal   Place 1 patch (21 mg total) onto the skin daily.   28 patch   0   . Oxcarbazepine (TRILEPTAL) 300 MG tablet   Oral   Take 3 tablets (900 mg total) by mouth at bedtime.   30  tablet   0   . Vortioxetine HBr (BRINTELLIX) 20 MG TABS   Oral   Take 20 mg by mouth daily.   30 tablet   0    Triage Vitals: BP 139/102  Pulse 127  Temp(Src) 98.8 F (37.1 C) (Oral)  Resp 28  Ht 5\' 3"  (1.6 m)  Wt 130 lb (58.968 kg)  BMI 23.03 kg/m2  SpO2 100%  LMP 12/20/2013  Physical Exam  Nursing note and vitals reviewed. Constitutional: She appears well-developed and well-nourished.  Rocking back and forth  HENT:  Head: Normocephalic and atraumatic.  Eyes: Conjunctivae are normal. Right eye exhibits no discharge. Left eye exhibits no discharge.  Neck: Neck supple.  Cardiovascular: Regular rhythm and normal heart sounds.  Exam reveals no gallop and no friction rub.   No murmur heard. tachycardia  Pulmonary/Chest: Breath sounds normal. No respiratory distress.  tachypnea  Abdominal: Soft. She exhibits no distension. There is no tenderness.  Right CVA tenderness  Musculoskeletal: She exhibits tenderness. She exhibits no edema.       Lumbar back: She exhibits tenderness. She exhibits  normal range of motion and no bony tenderness.       Back:  Neurological: She is alert. She displays no atrophy and normal reflexes. No cranial nerve deficit or sensory deficit. She exhibits normal muscle tone. Coordination and gait normal. GCS eye subscore is 4. GCS verbal subscore is 5. GCS motor subscore is 6. She displays no Babinski's sign on the right side. She displays no Babinski's sign on the left side.  Reflex Scores:      Patellar reflexes are 2+ on the right side and 2+ on the left side.      Achilles reflexes are 2+ on the right side and 2+ on the left side. 5/5 strength in bilateral hip flexors, quads, hamstrings (though initially with poor effort on the right, improved with distraction)  Skin: Skin is warm and dry.  Psychiatric: She has a normal mood and affect. Her behavior is normal. Thought content normal.    ED Course  Procedures (including critical care time)  DIAGNOSTIC STUDIES: Oxygen Saturation is 100% on RA, normal by my interpretation.    COORDINATION OF CARE: 6:26 PM- Pt advised of plan for treatment and pt agrees.  Labs Review Labs Reviewed  URINALYSIS, ROUTINE W REFLEX MICROSCOPIC  PREGNANCY, URINE   Imaging Review No results found.   EKG Interpretation None      Results for orders placed during the hospital encounter of 12/24/13  URINALYSIS, ROUTINE W REFLEX MICROSCOPIC      Result Value Ref Range   Color, Urine YELLOW  YELLOW   APPearance CLEAR  CLEAR   Specific Gravity, Urine 1.010  1.005 - 1.030   pH 6.0  5.0 - 8.0   Glucose, UA NEGATIVE  NEGATIVE mg/dL   Hgb urine dipstick SMALL (*) NEGATIVE   Bilirubin Urine NEGATIVE  NEGATIVE   Ketones, ur NEGATIVE  NEGATIVE mg/dL   Protein, ur NEGATIVE  NEGATIVE mg/dL   Urobilinogen, UA 1.0  0.0 - 1.0 mg/dL   Nitrite NEGATIVE  NEGATIVE   Leukocytes, UA TRACE (*) NEGATIVE  PREGNANCY, URINE      Result Value Ref Range   Preg Test, Ur NEGATIVE  NEGATIVE  URINE MICROSCOPIC-ADD ON      Result Value  Ref Range   Squamous Epithelial / LPF RARE  RARE   WBC, UA 0-2  <3 WBC/hpf   RBC / HPF 3-6  <3 RBC/hpf  Bacteria, UA FEW (*) RARE  CBC WITH DIFFERENTIAL      Result Value Ref Range   WBC 7.6  4.0 - 10.5 K/uL   RBC 4.16  3.87 - 5.11 MIL/uL   Hemoglobin 13.0  12.0 - 15.0 g/dL   HCT 91.4  78.2 - 95.6 %   MCV 89.9  78.0 - 100.0 fL   MCH 31.3  26.0 - 34.0 pg   MCHC 34.8  30.0 - 36.0 g/dL   RDW 21.3  08.6 - 57.8 %   Platelets 369  150 - 400 K/uL   Neutrophils Relative % 52  43 - 77 %   Neutro Abs 4.0  1.7 - 7.7 K/uL   Lymphocytes Relative 42  12 - 46 %   Lymphs Abs 3.2  0.7 - 4.0 K/uL   Monocytes Relative 5  3 - 12 %   Monocytes Absolute 0.4  0.1 - 1.0 K/uL   Eosinophils Relative 1  0 - 5 %   Eosinophils Absolute 0.1  0.0 - 0.7 K/uL   Basophils Relative 0  0 - 1 %   Basophils Absolute 0.0  0.0 - 0.1 K/uL  BASIC METABOLIC PANEL      Result Value Ref Range   Sodium 141  137 - 147 mEq/L   Potassium 3.4 (*) 3.7 - 5.3 mEq/L   Chloride 101  96 - 112 mEq/L   CO2 24  19 - 32 mEq/L   Glucose, Bld 104 (*) 70 - 99 mg/dL   BUN 4 (*) 6 - 23 mg/dL   Creatinine, Ser 4.69  0.50 - 1.10 mg/dL   Calcium 9.5  8.4 - 62.9 mg/dL   GFR calc non Af Amer >90  >90 mL/min   GFR calc Af Amer >90  >90 mL/min  D-DIMER, QUANTITATIVE      Result Value Ref Range   D-Dimer, Quant <0.27  0.00 - 0.48 ug/mL-FEU   Ct Abdomen Pelvis Wo Contrast  12/24/2013   CLINICAL DATA:  Back pain.  Right flank pain.  EXAM: CT ABDOMEN AND PELVIS WITHOUT CONTRAST  TECHNIQUE: Multidetector CT imaging of the abdomen and pelvis was performed following the standard protocol without intravenous contrast.  COMPARISON:  DG LUMBAR SPINE COMPLETE dated 12/11/2012  FINDINGS: Bilateral subglandular breast implants.  The noncontrast CT appearance of the liver, spleen, pancreas, and adrenal glands is within normal limits. No specific gallbladder or biliary abnormality identified. Kidneys and proximal ureters unremarkable. No pathologic upper  abdominal adenopathy is observed. No pathologic pelvic adenopathy is observed.  Appendix appears normal. No dilated small bowel. No specific uterine or adnexal contour abnormality. No free fluid identified.  Urinary bladder unremarkable.  There are chronic bilateral pars defects at L3, without anterolisthesis. Disc bulge with suspected left paracentral disc protrusion at the L3-4 level, potentially contributing to mild bilateral foraminal stenosis and possible mild left subarticular lateral recess stenosis.  IMPRESSION: 1. 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 2. Appendix normal. No hydronephrosis or renal or ureteral calculus observed.   Electronically Signed   By: Herbie Baltimore M.D.   On: 12/24/2013 19:06      MDM   MSK lower back pain   Patient here with right lower back pain and right flank pain - she reports this feels like either a kidney infection or kidney stone, there is no lab evidence of either, believe this to be MSK lower back pain - there are  no alarming signs to suggest cauda equina or cord compression - noted with tachycardia and tachpnea on exam, d-dimer negative so doubt PE at this point.  Will treat with NSAIDs and muscle relaxers.  I personally performed the services described in this documentation, which was scribed in my presence. The recorded information has been reviewed and is accurate.'    Scarlette Calico C. Marisue Humble, [place] 12/24/13 1939

## 2013-12-24 NOTE — ED Notes (Signed)
Right flank pain x3 days.  Much worse since last night.  Hx of UTI's.

## 2013-12-24 NOTE — Discharge Instructions (Signed)
Back Pain, Adult °Low back pain is very common. About 1 in 5 people have back pain. The cause of low back pain is rarely dangerous. The pain often gets better over time. About half of people with a sudden onset of back pain feel better in just 2 weeks. About 8 in 10 people feel better by 6 weeks.  °CAUSES °Some common causes of back pain include: °· Strain of the muscles or ligaments supporting the spine. °· Wear and tear (degeneration) of the spinal discs. °· Arthritis. °· Direct injury to the back. °DIAGNOSIS °Most of the time, the direct cause of low back pain is not known. However, back pain can be treated effectively even when the exact cause of the pain is unknown. Answering your caregiver's questions about your overall health and symptoms is one of the most accurate ways to make sure the cause of your pain is not dangerous. If your caregiver needs more information, he or she may order lab work or imaging tests (X-rays or MRIs). However, even if imaging tests show changes in your back, this usually does not require surgery. °HOME CARE INSTRUCTIONS °For many people, back pain returns. Since low back pain is rarely dangerous, it is often a condition that people can learn to manage on their own.  °· Remain active. It is stressful on the back to sit or stand in one place. Do not sit, drive, or stand in one place for more than 30 minutes at a time. Take short walks on level surfaces as soon as pain allows. Try to increase the length of time you walk each day. °· Do not stay in bed. Resting more than 1 or 2 days can delay your recovery. °· Do not avoid exercise or work. Your body is made to move. It is not dangerous to be active, even though your back may hurt. Your back will likely heal faster if you return to being active before your pain is gone. °· Pay attention to your body when you  bend and lift. Many people have less discomfort when lifting if they bend their knees, keep the load close to their bodies, and  avoid twisting. Often, the most comfortable positions are those that put less stress on your recovering back. °· Find a comfortable position to sleep. Use a firm mattress and lie on your side with your knees slightly bent. If you lie on your back, put a pillow under your knees. °· Only take over-the-counter or prescription medicines as directed by your caregiver. Over-the-counter medicines to reduce pain and inflammation are often the most helpful. Your caregiver may prescribe muscle relaxant drugs. These medicines help dull your pain so you can more quickly return to your normal activities and healthy exercise. °· Put ice on the injured area. °· Put ice in a plastic bag. °· Place a towel between your skin and the bag. °· Leave the ice on for 15-20 minutes, 03-04 times a day for the first 2 to 3 days. After that, ice and heat may be alternated to reduce pain and spasms. °· Ask your caregiver about trying back exercises and gentle massage. This may be of some benefit. °· Avoid feeling anxious or stressed. Stress increases muscle tension and can worsen back pain. It is important to recognize when you are anxious or stressed and learn ways to manage it. Exercise is a great option. °SEEK MEDICAL CARE IF: °· You have pain that is not relieved with rest or medicine. °· You have pain that does not improve in 1 week. °· You have new symptoms. °· You are generally not feeling well. °SEEK   IMMEDIATE MEDICAL CARE IF:  °· You have pain that radiates from your back into your legs. °· You develop new bowel or bladder control problems. °· You have unusual weakness or numbness in your arms or legs. °· You develop nausea or vomiting. °· You develop abdominal pain. °· You feel faint. °Document Released: 10/07/2005 Document Revised: 04/07/2012 Document Reviewed: 02/25/2011 °ExitCare® Patient Information ©2014 [employer]. ° °Lumbosacral Strain °Lumbosacral strain is a strain of any of the parts that make up your lumbosacral  vertebrae. Your lumbosacral vertebrae are the bones that make up the lower third of your backbone. Your lumbosacral vertebrae are held together by muscles and tough, fibrous tissue (ligaments).  °CAUSES  °A sudden blow to your back can cause lumbosacral strain. Also, anything that causes an excessive stretch of the muscles in the low back can cause this strain. This is typically seen when people exert themselves strenuously, fall, lift heavy objects, bend, or crouch repeatedly. °RISK FACTORS °· Physically demanding work. °· Participation in pushing or pulling sports or sports that require sudden twist of the back (tennis, golf, baseball). °· Weight lifting. °· Excessive lower back curvature. °· Forward-tilted pelvis. °· Weak back or abdominal muscles or both. °· Tight hamstrings. °SIGNS AND SYMPTOMS  °Lumbosacral strain may cause pain in the area of your injury or pain that moves (radiates) down your leg.  °DIAGNOSIS °Your health care provider can often diagnose lumbosacral strain through a physical exam. In some cases, you may need tests such as X-ray exams.  °TREATMENT  °Treatment for your lower back injury depends on many factors that your clinician will have to evaluate. However, most treatment will include the use of anti-inflammatory medicines. °HOME CARE INSTRUCTIONS  °· Avoid hard physical activities (tennis, racquetball, waterskiing) if you are not in proper physical condition for it. This may aggravate or create problems. °· If you have a back problem, avoid sports requiring sudden body movements. Swimming and walking are generally safer activities. °· Maintain good posture. °· Maintain a healthy weight. °· For acute conditions, you may put ice on the injured area. °· Put ice in a plastic bag. °· Place a towel between your skin and the bag. °· Leave the ice on for 20 minutes, 2 3 times a day. °· When the low back starts healing, stretching and strengthening exercises may be recommended. °SEEK MEDICAL CARE  IF: °· Your back pain is getting worse. °· You experience severe back pain not relieved with medicines. °SEEK IMMEDIATE MEDICAL CARE IF:  °· You have numbness, tingling, weakness, or problems with the use of your arms or legs. °· There is a change in bowel or bladder control. °· You have increasing pain in any area of the body, including your belly (abdomen). °· You notice shortness of breath, dizziness, or feel faint. °· You feel sick to your stomach (nauseous), are throwing up (vomiting), or become sweaty. °· You notice discoloration of your toes or legs, or your feet get very cold. °MAKE SURE YOU:  °· Understand these instructions. °· Will watch your condition. °· Will get help right away if you are not doing well or get worse. °Document Released: 07/17/2005 Document Revised: 07/28/2013 Document Reviewed: 05/26/2013 °ExitCare® Patient Information ©2014 [employer]. ° °

## 2013-12-24 NOTE — ED Notes (Signed)
Patient transported to CT 

## 2013-12-25 NOTE — ED Provider Notes (Signed)
Medical screening examination/treatment/procedure(s) were performed by non-physician practitioner and as supervising physician I was immediately available for consultation/collaboration.   EKG Interpretation None        Candyce ChurnJohn David Arizona Nordquist III, MD 12/25/13 [phone]

## 2014-03-30 ENCOUNTER — Inpatient Hospital Stay (HOSPITAL_COMMUNITY)
Admission: EM | Admit: 2014-03-30 | Discharge: 2014-04-01 | DRG: 917 | Disposition: A | Discharge status: Other Acute Inpatient Hospital | Payer: BC Managed Care – PPO | Attending: Internal Medicine | Admitting: Internal Medicine

## 2014-03-30 ENCOUNTER — Encounter (HOSPITAL_COMMUNITY): Payer: Self-pay | Admitting: Emergency Medicine

## 2014-03-30 DIAGNOSIS — X838XXA Intentional self-harm by other specified means, initial encounter: Secondary | ICD-10-CM

## 2014-03-30 DIAGNOSIS — T50902A Poisoning by unspecified drugs, medicaments and biological substances, intentional self-harm, initial encounter: Secondary | ICD-10-CM

## 2014-03-30 DIAGNOSIS — E861 Hypovolemia: Secondary | ICD-10-CM | POA: Diagnosis present

## 2014-03-30 DIAGNOSIS — T50992A Poisoning by other drugs, medicaments and biological substances, intentional self-harm, initial encounter: Secondary | ICD-10-CM | POA: Diagnosis present

## 2014-03-30 DIAGNOSIS — F3160 Bipolar disorder, current episode mixed, unspecified: Secondary | ICD-10-CM

## 2014-03-30 DIAGNOSIS — T50901A Poisoning by unspecified drugs, medicaments and biological substances, accidental (unintentional), initial encounter: Secondary | ICD-10-CM

## 2014-03-30 DIAGNOSIS — Z720 Tobacco use: Secondary | ICD-10-CM

## 2014-03-30 DIAGNOSIS — Z79899 Other long term (current) drug therapy: Secondary | ICD-10-CM

## 2014-03-30 DIAGNOSIS — N390 Urinary tract infection, site not specified: Secondary | ICD-10-CM

## 2014-03-30 DIAGNOSIS — R9431 Abnormal electrocardiogram [ECG] [EKG]: Secondary | ICD-10-CM

## 2014-03-30 DIAGNOSIS — F332 Major depressive disorder, recurrent severe without psychotic features: Secondary | ICD-10-CM | POA: Diagnosis present

## 2014-03-30 DIAGNOSIS — F411 Generalized anxiety disorder: Secondary | ICD-10-CM | POA: Diagnosis present

## 2014-03-30 DIAGNOSIS — T426X1A Poisoning by other antiepileptic and sedative-hypnotic drugs, accidental (unintentional), initial encounter: Principal | ICD-10-CM | POA: Diagnosis present

## 2014-03-30 DIAGNOSIS — T424X4A Poisoning by benzodiazepines, undetermined, initial encounter: Secondary | ICD-10-CM | POA: Diagnosis present

## 2014-03-30 DIAGNOSIS — T50904A Poisoning by unspecified drugs, medicaments and biological substances, undetermined, initial encounter: Secondary | ICD-10-CM

## 2014-03-30 DIAGNOSIS — F339 Major depressive disorder, recurrent, unspecified: Secondary | ICD-10-CM

## 2014-03-30 DIAGNOSIS — T438X2A Poisoning by other psychotropic drugs, intentional self-harm, initial encounter: Secondary | ICD-10-CM | POA: Diagnosis present

## 2014-03-30 DIAGNOSIS — K5900 Constipation, unspecified: Secondary | ICD-10-CM | POA: Diagnosis not present

## 2014-03-30 DIAGNOSIS — F329 Major depressive disorder, single episode, unspecified: Secondary | ICD-10-CM

## 2014-03-30 DIAGNOSIS — T510X4A Toxic effect of ethanol, undetermined, initial encounter: Secondary | ICD-10-CM | POA: Diagnosis present

## 2014-03-30 DIAGNOSIS — T43502A Poisoning by unspecified antipsychotics and neuroleptics, intentional self-harm, initial encounter: Secondary | ICD-10-CM | POA: Diagnosis present

## 2014-03-30 DIAGNOSIS — I959 Hypotension, unspecified: Secondary | ICD-10-CM

## 2014-03-30 DIAGNOSIS — R300 Dysuria: Secondary | ICD-10-CM

## 2014-03-30 DIAGNOSIS — T1491XA Suicide attempt, initial encounter: Secondary | ICD-10-CM

## 2014-03-30 DIAGNOSIS — F4310 Post-traumatic stress disorder, unspecified: Secondary | ICD-10-CM

## 2014-03-30 DIAGNOSIS — T6592XA Toxic effect of unspecified substance, intentional self-harm, initial encounter: Secondary | ICD-10-CM | POA: Diagnosis present

## 2014-03-30 DIAGNOSIS — F909 Attention-deficit hyperactivity disorder, unspecified type: Secondary | ICD-10-CM

## 2014-03-30 DIAGNOSIS — F17200 Nicotine dependence, unspecified, uncomplicated: Secondary | ICD-10-CM | POA: Diagnosis present

## 2014-03-30 DIAGNOSIS — Y92009 Unspecified place in unspecified non-institutional (private) residence as the place of occurrence of the external cause: Secondary | ICD-10-CM

## 2014-03-30 DIAGNOSIS — G9340 Encephalopathy, unspecified: Secondary | ICD-10-CM | POA: Diagnosis present

## 2014-03-30 LAB — URINALYSIS, ROUTINE W REFLEX MICROSCOPIC
Bilirubin Urine: NEGATIVE
GLUCOSE, UA: NEGATIVE mg/dL
HGB URINE DIPSTICK: NEGATIVE
Ketones, ur: NEGATIVE mg/dL
Nitrite: POSITIVE — AB
PROTEIN: NEGATIVE mg/dL
SPECIFIC GRAVITY, URINE: 1.019 (ref 1.005–1.030)
Urobilinogen, UA: 0.2 mg/dL (ref 0.0–1.0)
pH: 6 (ref 5.0–8.0)

## 2014-03-30 LAB — COMPREHENSIVE METABOLIC PANEL
ALT: 11 U/L (ref 0–35)
AST: 15 U/L (ref 0–37)
Albumin: 3.4 g/dL — ABNORMAL LOW (ref 3.5–5.2)
Alkaline Phosphatase: 79 U/L (ref 39–117)
BUN: 10 mg/dL (ref 6–23)
CO2: 20 mEq/L (ref 19–32)
Calcium: 8.8 mg/dL (ref 8.4–10.5)
Chloride: 105 mEq/L (ref 96–112)
Creatinine, Ser: 0.59 mg/dL (ref 0.50–1.10)
GFR calc non Af Amer: >90 mL/min (ref >90)
Glucose, Bld: 119 mg/dL — ABNORMAL HIGH (ref 70–99)
POTASSIUM: 4.3 meq/L (ref 3.7–5.3)
Sodium: 141 mEq/L (ref 137–147)
TOTAL PROTEIN: 6.4 g/dL (ref 6.0–8.3)
Total Bilirubin: <0.2 mg/dL — ABNORMAL LOW (ref 0.3–1.2)

## 2014-03-30 LAB — CBC
HEMATOCRIT: 34.9 % — AB (ref 36.0–46.0)
Hemoglobin: 11.3 g/dL — ABNORMAL LOW (ref 12.0–15.0)
MCH: 29.6 pg (ref 26.0–34.0)
MCHC: 32.4 g/dL (ref 30.0–36.0)
MCV: 91.4 fL (ref 78.0–100.0)
Platelets: 310 10*3/uL (ref 150–400)
RBC: 3.82 MIL/uL — ABNORMAL LOW (ref 3.87–5.11)
RDW: 13 % (ref 11.5–15.5)
WBC: 6.4 10*3/uL (ref 4.0–10.5)

## 2014-03-30 LAB — ACETAMINOPHEN LEVEL

## 2014-03-30 LAB — SALICYLATE LEVEL: Salicylate Lvl: <2 mg/dL — ABNORMAL LOW (ref 2.8–20.0)

## 2014-03-30 LAB — I-STAT CHEM 8, ED
BUN: 10 mg/dL (ref 6–23)
CALCIUM ION: 1.19 mmol/L (ref 1.12–1.23)
Chloride: 110 mEq/L (ref 96–112)
Creatinine, Ser: 0.6 mg/dL (ref 0.50–1.10)
GLUCOSE: 110 mg/dL — AB (ref 70–99)
HEMATOCRIT: 35 % — AB (ref 36.0–46.0)
Hemoglobin: 11.9 g/dL — ABNORMAL LOW (ref 12.0–15.0)
Potassium: 4 mEq/L (ref 3.7–5.3)
Sodium: 141 mEq/L (ref 137–147)
TCO2: 19 mmol/L (ref 0–100)

## 2014-03-30 LAB — RAPID URINE DRUG SCREEN, HOSP PERFORMED
Amphetamines: POSITIVE — AB
Barbiturates: NOT DETECTED
Benzodiazepines: POSITIVE — AB
COCAINE: NOT DETECTED
OPIATES: NOT DETECTED
TETRAHYDROCANNABINOL: NOT DETECTED

## 2014-03-30 LAB — CBG MONITORING, ED: Glucose-Capillary: 97 mg/dL (ref 70–99)

## 2014-03-30 LAB — URINE MICROSCOPIC-ADD ON

## 2014-03-30 LAB — CK: Total CK: 49 U/L (ref 7–177)

## 2014-03-30 LAB — ETHANOL

## 2014-03-30 LAB — MRSA PCR SCREENING: MRSA by PCR: NEGATIVE

## 2014-03-30 LAB — POC URINE PREG, ED: Preg Test, Ur: NEGATIVE

## 2014-03-30 MED ORDER — ONDANSETRON HCL 4 MG/2ML IJ SOLN
4.0000 mg | Freq: Four times a day (QID) | INTRAMUSCULAR | Status: DC | PRN
Start: 2014-03-30 — End: 2014-04-01

## 2014-03-30 MED ORDER — BIOTENE DRY MOUTH MT LIQD
15.0000 mL | Freq: Two times a day (BID) | OROMUCOSAL | Status: DC
  Administered 2014-03-31: 15 mL via OROMUCOSAL

## 2014-03-30 MED ORDER — HEPARIN SODIUM (PORCINE) 5000 UNIT/ML IJ SOLN
5000.0000 [IU] | Freq: Three times a day (TID) | INTRAMUSCULAR | Status: DC
  Filled 2014-03-30 (×7): qty 1

## 2014-03-30 MED ORDER — SODIUM CHLORIDE 0.9 % IV SOLN
INTRAVENOUS | Status: DC
  Administered 2014-03-31: 125 mL/h via INTRAVENOUS
  Administered 2014-03-31 – 2014-04-01 (×2): via INTRAVENOUS

## 2014-03-30 MED ORDER — SODIUM CHLORIDE 0.9 % IV BOLUS (SEPSIS)
1000.0000 mL | Freq: Once | INTRAVENOUS | Status: AC
  Administered 2014-03-30: 1000 mL via INTRAVENOUS

## 2014-03-30 MED ORDER — SODIUM CHLORIDE 0.9 % IV SOLN
Freq: Once | INTRAVENOUS | Status: AC
  Administered 2014-03-30: 17:00:00 via INTRAVENOUS

## 2014-03-30 MED ORDER — CEFTRIAXONE SODIUM 1 G IJ SOLR: 1.0000 g | Freq: Once | INTRAMUSCULAR | Status: DC

## 2014-03-30 MED ORDER — DEXTROSE 5 % IV SOLN
1.0000 g | Freq: Once | INTRAVENOUS | Status: AC
  Administered 2014-03-30: 1 g via INTRAVENOUS
  Filled 2014-03-30: qty 10

## 2014-03-30 MED ORDER — DEXTROSE 5 % IV SOLN
1.0000 g | INTRAVENOUS | Status: DC
  Administered 2014-03-31: 1 g via INTRAVENOUS
  Filled 2014-03-30 (×3): qty 10

## 2014-03-30 NOTE — ED Notes (Signed)
Pt presents to department for evaluation of overdose. Mother states she has been taking several different types of psychiatric medications at once in attempt to harm herself. Pt states she took (10) Buspar tablets today. States she feels very depressed and wants to die. Pt is lethargic and difficult to arouse upon arrival to department.

## 2014-03-30 NOTE — ED Provider Notes (Signed)
CSN: [id number]     Arrival date & time 03/30/14  1539 History   First MD Initiated Contact with Patient 03/30/14 1601     Chief Complaint  Patient presents with  . Drug Overdose     (Consider location/radiation/quality/duration/timing/severity/associated sxs/prior Treatment) Patient is a 33 y.o. female presenting with mental health disorder. The history is provided by the patient and a parent.  Mental Health Problem Presenting symptoms: self mutilation, suicidal thoughts, suicidal threats and suicide attempt   Patient accompanied by:  Family member Degree of incapacity (severity):  Severe Onset quality:  Gradual Timing:  Constant Progression:  Worsening Chronicity:  Chronic Context: alcohol use, drug abuse and medication   Relieved by:  Nothing Worsened by:  Nothing tried Associated symptoms: no abdominal pain, no chest pain and no headaches     Suicide attempt. Past 3 days ingesting medications. Uncertain amount. Has taken buspar, gabapentin, lamictal, xanax, trieptal, clonidine. Also with wine. Attempt to kill self per patient and mother. H/o same.  Denies any other attempt at self harm.  Brought in by mother. Says daughter was very fatigued 3 days ago. Thought it was just sleep deprivation at that time. Pt made suicidal text today. Family went to take medications. Daughter became upset and took more medications.    Past Medical History  Diagnosis Date  . Kidney infection   . UTI (lower urinary tract infection)   . ADD (attention deficit disorder)   . Anxiety   . Depression    Past Surgical History  Procedure Laterality Date  . Urethra surgery     No family history on file. History  Substance Use Topics  . Smoking status: Current Every Day Smoker -- 1.00 packs/day    Types: Cigarettes  . Smokeless tobacco: Not on file  . Alcohol Use: Yes   OB History   Grav Para Term Preterm Abortions TAB SAB Ect Mult Living                 Review of Systems  Constitutional:  Positive for activity change (more fatigued/tired). Negative for fever.  Respiratory: Negative for cough and shortness of breath.   Cardiovascular: Negative for chest pain and leg swelling.  Gastrointestinal: Negative for vomiting and abdominal pain.  Genitourinary: Negative for flank pain and difficulty urinating.  Skin: Negative for color change and wound.  Neurological: Negative for dizziness and headaches.  Psychiatric/Behavioral: Positive for suicidal ideas and self-injury.  All other systems reviewed and are negative.     Allergies  Review of patient's allergies indicates no known allergies.  Home Medications   Prior to Admission medications   Medication Sig Start Date End Date Taking? Authorizing Provider  ALPRAZolam (XANAX XR) 1 MG 24 hr tablet Take 1 mg by mouth 3 (three) times daily as needed for anxiety.   Yes Historical Provider, MD  amphetamine-dextroamphetamine (ADDERALL) 15 MG tablet Take 1 tablet (15 mg total) by mouth 2 (two) times daily. 11/04/13  Yes Beau Fanny, FNP  lamoTRIgine (LAMICTAL) 100 MG tablet Take 100 mg by mouth daily.   Yes Historical Provider, MD  Linaclotide Karlene Einstein) 145 MCG CAPS capsule Take 145 mcg by mouth daily.   Yes Historical Provider, MD  nicotine (NICODERM CQ - DOSED IN MG/24 HOURS) 21 mg/24hr patch Place 21 mg onto the skin daily as needed (to stop smoking).   Yes Historical Provider, MD  Oxcarbazepine (TRILEPTAL) 300 MG tablet Take 3 tablets (900 mg total) by mouth at bedtime. 11/04/13  Yes John C  Withrow, FNP   BP 70/44  Pulse 83  Temp(Src) 98.4 F (36.9 C) (Oral)  Resp 18  SpO2 97% Physical Exam  Nursing note and vitals reviewed. Constitutional: She is oriented to person, place, and time. She appears well-developed and well-nourished. She appears lethargic. No distress.  HENT:  Head: Normocephalic and atraumatic.  Eyes: Conjunctivae are normal. Pupils are equal, round, and reactive to light. Right eye exhibits no discharge. Left  eye exhibits no discharge.  Neck: No tracheal deviation present.  Cardiovascular: Normal rate, regular rhythm, normal heart sounds and intact distal pulses.   Pulmonary/Chest: Effort normal and breath sounds normal. No stridor. No respiratory distress. She has no wheezes. She has no rales.  Abdominal: Soft. She exhibits no distension. There is no tenderness. There is no guarding.  Musculoskeletal: She exhibits no edema and no tenderness.  Neurological: She is oriented to person, place, and time. She appears lethargic. GCS eye subscore is 3. GCS verbal subscore is 5. GCS motor subscore is 6.  Skin: Skin is warm and dry.  Psychiatric: She expresses suicidal ideation.    ED Course  Procedures (including critical care time) Labs Review Labs Reviewed  CBC - Abnormal; Notable for the following:    RBC 3.82 (*)    Hemoglobin 11.3 (*)    HCT 34.9 (*)    All other components within normal limits  COMPREHENSIVE METABOLIC PANEL - Abnormal; Notable for the following:    Glucose, Bld 119 (*)    Albumin 3.4 (*)    Total Bilirubin <0.2 (*)    All other components within normal limits  SALICYLATE LEVEL - Abnormal; Notable for the following:    Salicylate Lvl <2.0 (*)    All other components within normal limits  URINE RAPID DRUG SCREEN (HOSP PERFORMED) - Abnormal; Notable for the following:    Benzodiazepines POSITIVE (*)    Amphetamines POSITIVE (*)    All other components within normal limits  URINALYSIS, ROUTINE W REFLEX MICROSCOPIC - Abnormal; Notable for the following:    APPearance CLOUDY (*)    Nitrite POSITIVE (*)    Leukocytes, UA SMALL (*)    All other components within normal limits  URINE MICROSCOPIC-ADD ON - Abnormal; Notable for the following:    Bacteria, UA MANY (*)    All other components within normal limits  I-STAT CHEM 8, ED - Abnormal; Notable for the following:    Glucose, Bld 110 (*)    Hemoglobin 11.9 (*)    HCT 35.0 (*)    All other components within normal limits   MRSA PCR SCREENING  URINE CULTURE  ETHANOL  ACETAMINOPHEN LEVEL  CK  COMPREHENSIVE METABOLIC PANEL  MAGNESIUM  PHOSPHORUS  CBC  CBG MONITORING, ED  POC URINE PREG, ED    Imaging Review No results found.   EKG Interpretation None      MDM   Final diagnoses:  Drug overdose, intentional  Attention deficit disorder with hyperactivity(314.01)  Depression, major  Suicide attempt  Hypotension     OD. Suicide attempt. GCS 14. Hypotensive. Otherwise HDS. NS bolus.   Poison control called Gabapentin - cns and resp depression.  Buspar - monitor for sz's.  Trileptal - similar as above. Some bradycardia and hypotension.  Lamictal - similar as above. Some risk of rhabdo. Clonidine - bradycardia and hypotension. No narcan at this time 2/2 possible rebound hypertension.  Xanax - CNS and resp depression.   BP 80's systolic. MAP~65. Appears fatigued, but arousable. Maintaining airway. UA (cath)  concerning for UTI. Culture and rocephin. Admit to ICU.  Labs and imaging reviewed by myself and considered in medical decision making if ordered. Imaging interpreted by radiology.   Discussed case with Dr. Jeraldine Loots who is in agreement with assessment and plan.   Stevie Kern, MD 03/30/14 [phone]

## 2014-03-30 NOTE — H&P (Signed)
PULMONARY / CRITICAL CARE MEDICINE   Name: Heather Preston MRN: [id number] DOB: [birth year]    ADMISSION DATE:  03/30/2014  REFERRING MD :  Gerhard Munch  CHIEF COMPLAINT:  depressed  BRIEF PATIENT DESCRIPTION:  33 yo female with hx of depression, ADD with intentional drug overdose.  PCCM asked to admit to ICU.  SIGNIFICANT EVENTS: 6/10 Admit  STUDIES:   LINES / TUBES: PIV  CULTURES: Urine 6/10 >>   ANTIBIOTICS: Rocephin 6/10 >>  HISTORY OF PRESENT ILLNESS:   Pt has hx of depression.  She called her family today stating she was feeling more depressed, and took a bunch of pills.  Family went to her home and found that she was very sleepy.  They brought her to the ER.   In ER she was hypotensive >> responded to IV fluids.  She reports taking trileptal, xanax, gabapentin, and alcohol.  She has been prescribed adderall.  Her UDS was positive for benzo's and amphetamines.  Her alcohol level was < 11.  Her U/A was suggestive of UTI.  She feels foggy in her head.  She denies headache, chest pain, nausea, abdominal pain, or dyspnea.  PAST MEDICAL HISTORY :  Past Medical History  Diagnosis Date  . Kidney infection   . UTI (lower urinary tract infection)   . ADD (attention deficit disorder)   . Anxiety   . Depression    Past Surgical History  Procedure Laterality Date  . Urethra surgery     Prior to Admission medications   Medication Sig Start Date End Date Taking? Authorizing Provider  ALPRAZolam Prudy Feeler) 1 MG tablet Take 1 mg by mouth 2 (two) times daily.    Historical Provider, MD  amphetamine-dextroamphetamine (ADDERALL) 15 MG tablet Take 1 tablet (15 mg total) by mouth 2 (two) times daily. 11/04/13   Beau Fanny, FNP  ciprofloxacin (CIPRO) 500 MG tablet Take 1 tablet (500 mg total) by mouth 2 (two) times daily. 11/04/13   Beau Fanny, FNP  cyclobenzaprine (FLEXERIL) 10 MG tablet Take 1 tablet (10 mg total) by mouth 2 (two) times daily as needed for muscle spasms.  12/24/13   Izola Price. Sanford, PA-C  doxepin (SINEQUAN) 100 MG capsule Take 1 capsule (100 mg total) by mouth at bedtime. 11/04/13   Beau Fanny, FNP  hydrOXYzine (ATARAX/VISTARIL) 25 MG tablet Take 1 tablet (25 mg total) by mouth every 6 (six) hours as needed for anxiety. 11/04/13   Beau Fanny, FNP  ibuprofen (ADVIL,MOTRIN) 800 MG tablet Take 1 tablet (800 mg total) by mouth 3 (three) times daily. 12/24/13   Izola Price. Sanford, PA-C  nicotine (NICODERM CQ - DOSED IN MG/24 HOURS) 21 mg/24hr patch Place 1 patch (21 mg total) onto the skin daily. 11/04/13   Beau Fanny, FNP  Oxcarbazepine (TRILEPTAL) 300 MG tablet Take 3 tablets (900 mg total) by mouth at bedtime. 11/04/13   Beau Fanny, FNP  Vortioxetine HBr (BRINTELLIX) 20 MG TABS Take 20 mg by mouth daily. 11/04/13   Beau Fanny, FNP  Vortioxetine HBr (BRINTELLIX) 20 MG TABS Take 20 mg by mouth daily.    Historical Provider, MD   No Known Allergies  FAMILY HISTORY:  No family history on file. SOCIAL HISTORY:  reports that she has been smoking Cigarettes.  She has been smoking about 1.00 pack per day. She does not have any smokeless tobacco history on file. She reports that she drinks alcohol. She reports that she does not use illicit drugs.  REVIEW OF SYSTEMS:    SUBJECTIVE:   VITAL SIGNS: Temp:  [98.4 F (36.9 C)] 98.4 F (36.9 C) (06/10 1557) Pulse Rate:  [58-83] 59 (06/10 1800) Resp:  [11-18] 11 (06/10 1800) BP: (70-91)/(44-67) 91/64 mmHg (06/10 1800) SpO2:  [96 %-100 %] 100 % (06/10 1800) INTAKE / OUTPUT: Intake/Output     06/09 0701 - 06/10 0700 06/10 0701 - 06/11 0700   I.V.  2000   Total Intake   2000   Urine  100   Total Output   100   Net   +1900          PHYSICAL EXAMINATION: General: no distress Neuro:  Sleepy, wakes up easily, follows commands, moves all extremities HEENT:  Pupils reactive, no sinus tenderness, no oral exudate, no LAN Cardiovascular:  Regular, bradycardic, no murmur Lungs:  No  wheeze/rales Abdomen:  Soft, non tender, + bowel sounds Musculoskeletal:  No edema Skin:  No rashes  LABS:  CBC  Recent Labs Lab 03/30/14 1616 03/30/14 1625  WBC 6.4  --   HGB 11.3* 11.9*  HCT 34.9* 35.0*  PLT 310  --    BMET  Recent Labs Lab 03/30/14 1616 03/30/14 1625  NA 141 141  K 4.3 4.0  CL 105 110  CO2 20  --   BUN 10 10  CREATININE 0.59 0.60  GLUCOSE 119* 110*   Electrolytes  Recent Labs Lab 03/30/14 1616  CALCIUM 8.8   Liver Enzymes  Recent Labs Lab 03/30/14 1616  AST 15  ALT 11  ALKPHOS 79  BILITOT <0.2*  ALBUMIN 3.4*   Glucose  Recent Labs Lab 03/30/14 1627  GLUCAP 97    Imaging No results found.  ASSESSMENT / PLAN:  PULMONARY A: At risk for aspiration. P:   Monitor in ICU  CARDIOVASCULAR A:  Hypotension likely from meds and hypovolemia. P:  Continue IV fluids Monitor hemodynamics  RENAL A:   No acute issues. P:   Monitor renal fx, urine outpt, electrolytes  GASTROINTESTINAL A:   Nutrition. P:   NPO until mental status improved SUP not indicated at present  HEMATOLOGIC A:   No acute issues. P:  F/u CBC SQ heparin for DVT prevention  INFECTIOUS A:   UTI. P:   Day 1 rocephin  ENDOCRINE A:   No acute issues.   P:   Monitor blood sugar on BMET  NEUROLOGIC A:   Acute encephalopathy 2nd to intention drug overdose with hx of depression, anxiety, ADD. P:   Monitor mental status in ICU Will need f/u with psych when more stable  Updated pt's father about plan.  CC time 40 minutes.  Coralyn HellingVineet Makinsey Pepitone, MD [facility] 03/30/2014, 6:33 PM Pager:  [phone] After 3pm call: [phone]

## 2014-03-30 NOTE — Progress Notes (Signed)
eLink Physician-Brief Progress Note Patient Name: Heather Preston DOB: 08-29-1981 MRN: [id number]  Date of Service  03/30/2014   HPI/Events of Note   Pt wants food High risk for lethrag6y further  eICU Interventions  No food   Intervention Category Minor Interventions: Routine modifications to care plan (e.g. PRN medications for pain, fever)  Nelda Bucks. 03/30/2014, 7:47 PM

## 2014-03-31 DIAGNOSIS — T50902A Poisoning by unspecified drugs, medicaments and biological substances, intentional self-harm, initial encounter: Secondary | ICD-10-CM

## 2014-03-31 DIAGNOSIS — N390 Urinary tract infection, site not specified: Secondary | ICD-10-CM

## 2014-03-31 DIAGNOSIS — R300 Dysuria: Secondary | ICD-10-CM

## 2014-03-31 DIAGNOSIS — F3160 Bipolar disorder, current episode mixed, unspecified: Secondary | ICD-10-CM

## 2014-03-31 DIAGNOSIS — F4310 Post-traumatic stress disorder, unspecified: Secondary | ICD-10-CM

## 2014-03-31 DIAGNOSIS — F332 Major depressive disorder, recurrent severe without psychotic features: Secondary | ICD-10-CM

## 2014-03-31 LAB — COMPREHENSIVE METABOLIC PANEL
ALBUMIN: 2.8 g/dL — AB (ref 3.5–5.2)
ALT: 18 U/L (ref 0–35)
AST: 25 U/L (ref 0–37)
Alkaline Phosphatase: 72 U/L (ref 39–117)
BUN: 8 mg/dL (ref 6–23)
CO2: 23 meq/L (ref 19–32)
Calcium: 8.2 mg/dL — ABNORMAL LOW (ref 8.4–10.5)
Chloride: 113 mEq/L — ABNORMAL HIGH (ref 96–112)
Creatinine, Ser: 0.54 mg/dL (ref 0.50–1.10)
GFR calc Af Amer: >90 mL/min (ref >90)
Glucose, Bld: 98 mg/dL (ref 70–99)
Potassium: 4.3 mEq/L (ref 3.7–5.3)
SODIUM: 145 meq/L (ref 137–147)
Total Protein: 5.3 g/dL — ABNORMAL LOW (ref 6.0–8.3)

## 2014-03-31 LAB — CBC
HEMATOCRIT: 31.9 % — AB (ref 36.0–46.0)
Hemoglobin: 10.4 g/dL — ABNORMAL LOW (ref 12.0–15.0)
MCH: 30.3 pg (ref 26.0–34.0)
MCHC: 32.6 g/dL (ref 30.0–36.0)
MCV: 93 fL (ref 78.0–100.0)
Platelets: 266 10*3/uL (ref 150–400)
RBC: 3.43 MIL/uL — ABNORMAL LOW (ref 3.87–5.11)
RDW: 13.4 % (ref 11.5–15.5)
WBC: 6.4 10*3/uL (ref 4.0–10.5)

## 2014-03-31 LAB — PHOSPHORUS: Phosphorus: 2.5 mg/dL (ref 2.3–4.6)

## 2014-03-31 LAB — MAGNESIUM: Magnesium: 1.7 mg/dL (ref 1.5–2.5)

## 2014-03-31 MED ORDER — LIDOCAINE HCL 2 % EX GEL
Freq: Once | CUTANEOUS | Status: AC
  Administered 2014-03-31: 5 via TOPICAL
  Filled 2014-03-31 (×2): qty 5

## 2014-03-31 MED ORDER — ACETAMINOPHEN 325 MG PO TABS
650.0000 mg | ORAL_TABLET | Freq: Four times a day (QID) | ORAL | Status: DC | PRN
  Administered 2014-03-31 (×2): 650 mg via ORAL
  Filled 2014-03-31 (×2): qty 2

## 2014-03-31 MED ORDER — NICOTINE 21 MG/24HR TD PT24
21.0000 mg | MEDICATED_PATCH | TRANSDERMAL | Status: DC
  Administered 2014-03-31 – 2014-04-01 (×2): 21 mg via TRANSDERMAL
  Filled 2014-03-31 (×3): qty 1

## 2014-03-31 MED ORDER — PHENAZOPYRIDINE HCL 200 MG PO TABS
200.0000 mg | ORAL_TABLET | Freq: Three times a day (TID) | ORAL | Status: DC
  Administered 2014-03-31 – 2014-04-01 (×4): 200 mg via ORAL
  Filled 2014-03-31 (×6): qty 1

## 2014-03-31 MED ORDER — OXCARBAZEPINE 300 MG PO TABS
900.0000 mg | ORAL_TABLET | Freq: Every day | ORAL | Status: DC
  Administered 2014-03-31 (×2): 900 mg via ORAL
  Filled 2014-03-31 (×3): qty 3

## 2014-03-31 MED ORDER — DOCUSATE SODIUM 100 MG PO CAPS
100.0000 mg | ORAL_CAPSULE | Freq: Two times a day (BID) | ORAL | Status: DC
  Administered 2014-03-31 – 2014-04-01 (×3): 100 mg via ORAL
  Filled 2014-03-31 (×4): qty 1

## 2014-03-31 MED ORDER — BISACODYL 5 MG PO TBEC: 5.0000 mg | DELAYED_RELEASE_TABLET | Freq: Every day | ORAL | Status: DC | PRN

## 2014-03-31 NOTE — Progress Notes (Signed)
eLink Physician-Brief Progress Note Patient Name: Heather Preston DOB: 02-07-1981 MRN: [id number]  Date of Service  03/31/2014   HPI/Events of Note  Patient now awake. Crying. Wants nicotine patch and her trileptal. She says she over dosed on clonidine buspar and gabapentin   eICU Interventions  Nicotine patch Home trileptal   Intervention Category Intermediate Interventions: Other:  Princes Finger 03/31/2014, 1:51 AM

## 2014-03-31 NOTE — Progress Notes (Signed)
eLink Physician-Brief Progress Note Patient Name: Heather Preston DOB: [birth year] MRN: [id number]  Date of Service  03/31/2014   HPI/Events of Note  Anxiety reported   eICU Interventions  Psych does not recommend any meds at this stage   Intervention Category Minor Interventions: Agitation / anxiety - evaluation and management  Nelda Bucks. 03/31/2014, 7:20 PM
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
## 2014-03-31 NOTE — Progress Notes (Signed)
Heather Preston [id number]  Transfer Data: 03/31/2014 5:21 PM  Attending Provider: Coralyn Helling, MD  PCP:No PCP Per Patient  Code Status: Full  Heather Preston is a 33 y.o. female patient transferred from 52M  -No acute distress noted.  -No complaints of shortness of breath.  -No complaints of chest pain.    Blood pressure 115/79, pulse 92, temperature 98.4 F (36.9 C), temperature source Axillary, resp. rate 16, height 5\' 3"  (1.6 m), weight 55.9 kg (123 lb 3.8 oz), SpO2 97.00%.  ?  IV Fluids: IV in place, occlusive dsg intact without redness, IV cath forearm left, condition patent and no redness  normal saline.  Allergies: Review of patient's allergies indicates no known allergies.  Past Medical History:  has a past medical history of Kidney infection; UTI (lower urinary tract infection); ADD (attention deficit disorder); Anxiety; and Depression.  Past Surgical History:  has past surgical history that includes Urethra surgery.  Social History:  reports that she has been smoking Cigarettes.  She has been smoking about 1.00 pack per day. She does not have any smokeless tobacco history on file. She reports that she drinks alcohol. She reports that she does not use illicit drugs.  Skin: intact  Patient/Family orientated to room. Information packet given to patient/family. Admission inpatient armband information verified with patient/family to include name and date of birth and placed on patient arm. Side rails up x 2, fall assessment and education completed with patient/family. Patient/family able to verbalize understanding of risk associated with falls and verbalized understanding to call for assistance before getting out of bed. Call light within reach. Patient/family able to voice and demonstrate understanding of unit orientation instructions.  Will continue to evaluate and treat per MD orders.

## 2014-03-31 NOTE — Progress Notes (Signed)
Report called to receiving nurse. Past medical history and past hospitalization course reported. All questions answered. Patient transferred via wheelchair with sitter at side. All belongings taken to floor by sitter.

## 2014-03-31 NOTE — Progress Notes (Signed)
Pt c/o feeling like she going to have a panic attack. Rn tried to page pysch MD with no successful result. Critical Care MD on call was called and MD refused to give pt anything. Will continue to monitor.

## 2014-03-31 NOTE — Progress Notes (Signed)
UR Completed.  Neiva Maenza Jane [phone] 03/31/2014  

## 2014-03-31 NOTE — Progress Notes (Signed)
PULMONARY / CRITICAL CARE MEDICINE   Name: Heather Preston MRN: [id number] DOB: November 14, 1980    ADMISSION DATE:  03/30/2014  REFERRING MD :  Gerhard Munch  CHIEF COMPLAINT:  Depressed  BRIEF PATIENT DESCRIPTION:  33 yo female with hx of depression, ADD with intentional drug overdose.  PCCM asked to admit to ICU.   SIGNIFICANT EVENTS: 6/10 Admit 6/11 Improvement in mental status. Psych consult needed 6/11 Transfer to [facility] for further management  STUDIES:   LINES / TUBES: PIV  CULTURES: Urine 6/10 >> MRSA PCR 6/10>>>neg   ANTIBIOTICS: Rocephin 6/10 >>  SUBJECTIVE:  No major events overnight per nurse.  Pressures soft but responding well to IVF.  Patient states that she is feeling much better today, with her only complaint being some constipation which is normal for her. Takes medication for this at home.  Patient denies any current SI or HI. Denies A/V hallucinations. States that she has attempted suicide in the past.  Her main concern at this time is that she will not be put back on her home medications.  VITAL SIGNS: Temp:  [97.9 F (36.6 C)-98.7 F (37.1 C)] 98.6 F (37 C) (06/11 0759) Pulse Rate:  [55-83] 82 (06/11 0700) Resp:  [11-19] 16 (06/11 0700) BP: (70-126)/(44-98) 125/89 mmHg (06/11 0600) SpO2:  [95 %-100 %] 97 % (06/11 0700) Weight:  [55.9 kg (123 lb 3.8 oz)] 55.9 kg (123 lb 3.8 oz) (06/10 1900) INTAKE / OUTPUT: Intake/Output     06/10 0701 - 06/11 0700 06/11 0701 - 06/12 0700   I.V. (mL/kg) 3437.5 (61.5) 250 (4.5)   Total Intake(mL/kg) 3437.5 (61.5) 250 (4.5)   Urine (mL/kg/hr) 100    Total Output 100     Net +3337.5 +250        Urine Occurrence 1 x 1 x     PHYSICAL EXAMINATION: General: White woman, resting comfortably in bed, NAD. Afebrile. Neuro:  Mildly sleepy, but easily woken by verbal stimuli.  Cooperative with exam. Moves all extremities. HEENT:  Pupils reactive, no sinus tenderness, no oral exudate, no LAN. Oral mucosa pink and  moist. Cardiovascular:  RRR, no m/g/r.  No longer bradycardic.  Pressures soft overnight. Peripheral pulses present and equal bil. Lungs:  Respirations even and unlabored, lungs clear to auscultation bilaterally. Abdomen:  Soft, nontender, nondistended. + BS Musculoskeletal:  No edema noted.  Skin: Warm and dry.  No rashes or lesions noted.  LABS:  CBC  Recent Labs Lab 03/30/14 1616 03/30/14 1625 03/31/14 0318  WBC 6.4  --  6.4  HGB 11.3* 11.9* 10.4*  HCT 34.9* 35.0* 31.9*  PLT 310  --  266   BMET  Recent Labs Lab 03/30/14 1616 03/30/14 1625 03/31/14 0318  NA 141 141 145  K 4.3 4.0 4.3  CL 105 110 113*  CO2 20  --  23  BUN 10 10 8   CREATININE 0.59 0.60 0.54  GLUCOSE 119* 110* 98   Electrolytes  Recent Labs Lab 03/30/14 1616 03/31/14 0318  CALCIUM 8.8 8.2*  MG  --  1.7  PHOS  --  2.5   Liver Enzymes  Recent Labs Lab 03/30/14 1616 03/31/14 0318  AST 15 25  ALT 11 18  ALKPHOS 79 72  BILITOT <0.2* <0.2*  ALBUMIN 3.4* 2.8*   Glucose  Recent Labs Lab 03/30/14 1627  GLUCAP 97    Imaging No results found.  ASSESSMENT / PLAN:  PULMONARY A: No acute issues at this time Risk for aspiration>>diminished P:   -  Aspiration risk diminished, as mental status has improved. - Plan for transfer to floor  CARDIOVASCULAR A:  Hypotension likely from meds and hypovolemia. - responding well to IVF. Pressures soft overnight but increasing. P:  - Continue IV fluids - Monitor hemodynamics  RENAL A:   No acute issues P:   - Monitor renal fx, urine outpt, electrolytes  GASTROINTESTINAL A:   Nutrition Constipation P:   - Mental status improved, restart diet as tolerated - Consider restart linaclotide (home med) or similar - SUP not indicated at present  HEMATOLOGIC A:   No acute issues P:  - F/u CBC - SQ heparin for DVT prevention, has been refusing  INFECTIOUS A:   UTI P:   - Continue Rocephin - Follow CBC - Pyridium for urethral  pain due to UTI  ENDOCRINE A:   No acute issues  P:   - Monitor blood sugar on BMet  NEUROLOGIC A:   Acute encephalopathy 2nd to intention drug overdose with hx of depression, anxiety, ADD Hx depression  P:   - Improvement in mental status, continue to monitor - Continue trileptal (home med) - Sitter in room - Psych consulted - PRN Acetaminophen for pain  Plan for psych consult and transfer to [facility] today.  Elenore PaddyStephanie M. Pecola Leisureeese, PA-S  Attending:  I have seen and examined the patient with nurse practitioner/resident and agree with the note above.   Psyche consulted Suicide precautions Treat uti  PCCM off  Heber CarolinaBrent Greer Wainright, MD [place] [facility] Pager: [phone] Cell: [phone] If no response, call [phone]

## 2014-04-01 ENCOUNTER — Encounter (HOSPITAL_COMMUNITY): Payer: Self-pay | Admitting: *Deleted

## 2014-04-01 ENCOUNTER — Inpatient Hospital Stay (HOSPITAL_COMMUNITY)
Admission: AD | Admit: 2014-04-01 | Discharge: 2014-04-06 | DRG: 885 | Disposition: A | Discharge status: Home / Self Care | Payer: BC Managed Care – PPO | Source: Intra-hospital | Attending: Psychiatry | Admitting: Psychiatry

## 2014-04-01 DIAGNOSIS — F332 Major depressive disorder, recurrent severe without psychotic features: Principal | ICD-10-CM | POA: Diagnosis present

## 2014-04-01 DIAGNOSIS — F17200 Nicotine dependence, unspecified, uncomplicated: Secondary | ICD-10-CM | POA: Diagnosis present

## 2014-04-01 DIAGNOSIS — F329 Major depressive disorder, single episode, unspecified: Secondary | ICD-10-CM | POA: Diagnosis present

## 2014-04-01 DIAGNOSIS — F319 Bipolar disorder, unspecified: Secondary | ICD-10-CM | POA: Diagnosis present

## 2014-04-01 DIAGNOSIS — F4310 Post-traumatic stress disorder, unspecified: Secondary | ICD-10-CM | POA: Diagnosis present

## 2014-04-01 DIAGNOSIS — K5900 Constipation, unspecified: Secondary | ICD-10-CM | POA: Diagnosis present

## 2014-04-01 DIAGNOSIS — F411 Generalized anxiety disorder: Secondary | ICD-10-CM | POA: Diagnosis present

## 2014-04-01 DIAGNOSIS — G4700 Insomnia, unspecified: Secondary | ICD-10-CM | POA: Diagnosis present

## 2014-04-01 DIAGNOSIS — F909 Attention-deficit hyperactivity disorder, unspecified type: Secondary | ICD-10-CM | POA: Diagnosis present

## 2014-04-01 DIAGNOSIS — R45851 Suicidal ideations: Secondary | ICD-10-CM

## 2014-04-01 DIAGNOSIS — F339 Major depressive disorder, recurrent, unspecified: Secondary | ICD-10-CM

## 2014-04-01 LAB — URINE CULTURE: Colony Count: >=100000

## 2014-04-01 MED ORDER — POLYETHYLENE GLYCOL 3350 17 G PO PACK: 17.0000 g | PACK | Freq: Two times a day (BID) | ORAL | Status: DC

## 2014-04-01 MED ORDER — POLYETHYLENE GLYCOL 3350 17 G PO PACK
17.0000 g | PACK | Freq: Two times a day (BID) | ORAL | Status: DC
  Administered 2014-04-01 – 2014-04-05 (×3): 17 g via ORAL
  Filled 2014-04-01 (×15): qty 1

## 2014-04-01 MED ORDER — CLONAZEPAM 0.5 MG PO TABS
0.5000 mg | ORAL_TABLET | Freq: Every day | ORAL | Status: DC
  Administered 2014-04-01: 0.5 mg via ORAL
  Filled 2014-04-01: qty 1

## 2014-04-01 MED ORDER — NICOTINE 21 MG/24HR TD PT24
21.0000 mg | MEDICATED_PATCH | Freq: Every day | TRANSDERMAL | Status: DC
  Administered 2014-04-02: 21 mg via TRANSDERMAL
  Filled 2014-04-01 (×4): qty 1

## 2014-04-01 MED ORDER — MAGNESIUM HYDROXIDE 400 MG/5ML PO SUSP: 30.0000 mL | Freq: Every day | ORAL | Status: DC | PRN

## 2014-04-01 MED ORDER — LINACLOTIDE 145 MCG PO CAPS
145.0000 ug | ORAL_CAPSULE | Freq: Every day | ORAL | Status: DC
  Administered 2014-04-02 – 2014-04-06 (×5): 145 ug via ORAL
  Filled 2014-04-01 (×7): qty 1

## 2014-04-01 MED ORDER — CLONAZEPAM 0.5 MG PO TABS
0.5000 mg | ORAL_TABLET | Freq: Every day | ORAL | Status: DC
  Administered 2014-04-02 – 2014-04-03 (×2): 0.5 mg via ORAL
  Filled 2014-04-01 (×3): qty 1

## 2014-04-01 MED ORDER — PHENAZOPYRIDINE HCL 200 MG PO TABS: 200.0000 mg | ORAL_TABLET | Freq: Three times a day (TID) | ORAL | Status: DC

## 2014-04-01 MED ORDER — SODIUM CHLORIDE 0.9 % IV SOLN: INTRAVENOUS | Status: DC

## 2014-04-01 MED ORDER — CEFUROXIME AXETIL 500 MG PO TABS: 500.0000 mg | ORAL_TABLET | Freq: Two times a day (BID) | ORAL | Status: DC

## 2014-04-01 MED ORDER — CEFUROXIME AXETIL 500 MG PO TABS
500.0000 mg | ORAL_TABLET | Freq: Two times a day (BID) | ORAL | Status: AC
  Administered 2014-04-01 – 2014-04-04 (×6): 500 mg via ORAL
  Filled 2014-04-01 (×7): qty 1

## 2014-04-01 MED ORDER — POLYETHYLENE GLYCOL 3350 17 G PO PACK
17.0000 g | PACK | Freq: Two times a day (BID) | ORAL | Status: DC
  Administered 2014-04-01: 17 g via ORAL
  Filled 2014-04-01 (×2): qty 1

## 2014-04-01 MED ORDER — ALUM & MAG HYDROXIDE-SIMETH 200-200-20 MG/5ML PO SUSP: 30.0000 mL | ORAL | Status: DC | PRN

## 2014-04-01 MED ORDER — LAMOTRIGINE 100 MG PO TABS
100.0000 mg | ORAL_TABLET | Freq: Every day | ORAL | Status: DC
  Administered 2014-04-01: 100 mg via ORAL
  Filled 2014-04-01 (×2): qty 1

## 2014-04-01 MED ORDER — OXCARBAZEPINE 300 MG PO TABS
900.0000 mg | ORAL_TABLET | Freq: Every day | ORAL | Status: DC
  Administered 2014-04-01 – 2014-04-04 (×4): 900 mg via ORAL
  Filled 2014-04-01 (×8): qty 3

## 2014-04-01 MED ORDER — ACETAMINOPHEN 325 MG PO TABS
650.0000 mg | ORAL_TABLET | Freq: Four times a day (QID) | ORAL | Status: DC | PRN
  Administered 2014-04-04 – 2014-04-06 (×6): 650 mg via ORAL
  Filled 2014-04-01 (×6): qty 2

## 2014-04-01 MED ORDER — DSS 100 MG PO CAPS: 100.0000 mg | ORAL_CAPSULE | Freq: Two times a day (BID) | ORAL | Status: DC

## 2014-04-01 MED ORDER — DOCUSATE SODIUM 100 MG PO CAPS
100.0000 mg | ORAL_CAPSULE | Freq: Two times a day (BID) | ORAL | Status: DC
  Administered 2014-04-01 – 2014-04-06 (×7): 100 mg via ORAL
  Filled 2014-04-01 (×16): qty 1

## 2014-04-01 MED ORDER — ALPRAZOLAM ER 0.5 MG PO TB24: 0.5000 mg | ORAL_TABLET | Freq: Every day | ORAL | Status: DC

## 2014-04-01 MED ORDER — LAMOTRIGINE 100 MG PO TABS
100.0000 mg | ORAL_TABLET | Freq: Every day | ORAL | Status: DC
  Administered 2014-04-02 – 2014-04-06 (×5): 100 mg via ORAL
  Filled 2014-04-01 (×7): qty 1

## 2014-04-01 MED ORDER — PHENAZOPYRIDINE HCL 200 MG PO TABS
200.0000 mg | ORAL_TABLET | Freq: Three times a day (TID) | ORAL | Status: DC
  Administered 2014-04-02 – 2014-04-05 (×12): 200 mg via ORAL
  Filled 2014-04-01 (×10): qty 1
  Filled 2014-04-01: qty 2
  Filled 2014-04-01 (×9): qty 1

## 2014-04-01 NOTE — Progress Notes (Signed)
Patient ID: Denzil MagnusonKrista Preston, female   DOB: 04/03/1981, 33 y.o.   MRN: [id number] Admit Note. D. [facility] status post overdose in suicide attempt. Upon admission Dot LanesKrista states '' They thought I needed to come here after I took a few of my clonidine and some other medications. I was not sleeping and I just wanted to sleep. '' Pt denies this was an overdose attempt but does endorse feeling depressed, recent stressors including moving, and physical ailments. She denies any AH/VH /HI and is able to contract for safety. A. Skin searched and no contraband found. Small bruises to lower legs and thigh. Pt oriented to unit. Low Fall Risk. R. Pt is safe. Will continue to monitor q 15 minutes for safety.

## 2014-04-01 NOTE — Progress Notes (Signed)
Patient ID: Denzil MagnusonKrista Preston, female   DOB: 01/02/1981, 33 y.o.   MRN: [id number] Pt visible in the milieu.  Interacting appropriately with staff and peers.  Support provided.  Pt receptive.  Pt signed 72 hour request for discharge.  Pt denied SI, HI and AVH.  Fifteen minute checks in progress for patient safety.  Pt safe on unit.

## 2014-04-01 NOTE — Discharge Instructions (Signed)
Follow with Primary MD  in 7 days   Get CBC, CMP  checked  by Primary MD next visit.    Activity: As tolerated with Full fall precautions use walker/cane & assistance as needed   Disposition [facility]   Diet: Heart Healthy    For Heart failure patients - Check your Weight same time everyday, if you gain over 2 pounds, or you develop in leg swelling, experience more shortness of breath or chest pain, call your Primary MD immediately. Follow Cardiac Low Salt Diet and 1.8 lit/day fluid restriction.   On your next visit with her primary care physician please Get Medicines reviewed and adjusted.  Please request your Prim.MD to go over all Hospital Tests and Procedure/Radiological results at the follow up, please get all Hospital records sent to your Prim MD by signing hospital release before you go home.   If you experience worsening of your admission symptoms, develop shortness of breath, life threatening emergency, suicidal or homicidal thoughts you must seek medical attention immediately by calling 911 or calling your MD immediately  if symptoms less severe.  You Must read complete instructions/literature along with all the possible adverse reactions/side effects for all the Medicines you take and that have been prescribed to you. Take any new Medicines after you have completely understood and accpet all the possible adverse reactions/side effects.   Do not drive and provide baby sitting services if your were admitted for syncope or siezures until you have seen by Primary MD or a Neurologist and advised to do so again.  Do not drive when taking Pain medications.    Do not take more than prescribed Pain, Sleep and Anxiety Medications  Special Instructions: If you have smoked or chewed Tobacco  in the last 2 yrs please stop smoking, stop any regular Alcohol  and or any Recreational drug use.  Wear Seat belts while driving.   Please note  You were cared for by a hospitalist during your  hospital stay. If you have any questions about your discharge medications or the care you received while you were in the hospital after you are discharged, you can call the unit and asked to speak with the hospitalist on call if the hospitalist that took care of you is not available. Once you are discharged, your primary care physician will handle any further medical issues. Please note that NO REFILLS for any discharge medications will be authorized once you are discharged, as it is imperative that you return to your primary care physician (or establish a relationship with a primary care physician if you do not have one) for your aftercare needs so that they can reassess your need for medications and monitor your lab values.

## 2014-04-01 NOTE — Progress Notes (Signed)
Report given to Behavioral Health. 

## 2014-04-01 NOTE — Progress Notes (Signed)
Adult Psychoeducational Group Note  Date:  04/01/2014 Time:  9:00 PM  Group Topic/Focus:  Wrap-Up Group:   The focus of this group is to help patients review their daily goal of treatment and discuss progress on daily workbooks.  Participation Level:  Active  Participation Quality:  Appropriate, Attentive and Sharing  Affect:  Appropriate  Cognitive:  Alert and Appropriate  Insight: Good  Engagement in Group:  Engaged  Modes of Intervention:  Discussion, Education, Exploration, Socialization and Support  Additional Comments:  Pt came to group and shared her story for what brought her here to the hospital and shared that she was glad she asked for help.  Cathlean CowerClouse, Shadoe Bethel Y 04/01/2014, 9:00 PM
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
## 2014-04-01 NOTE — Clinical Social Work Psych Note (Signed)
[facility].  [facility] very [facility] this [facility].  Psych CSW reviewed the Voluntary Form with pt.  Pt agreeable to inpatient hospitalization, signed the appropriate paperwork.  Psych CSW witnessed the paperwork and placed originals on chart.  Psych CS[facility].  Report should be called to [phone] Juel [facility] will transport patient.  RN to call for transportation once bed assignment has been reported.  [phone].  Original Voluntary form to [facility].  Heather Preston, LCSWA [phone]  Clinical Social Work

## 2014-04-01 NOTE — Progress Notes (Signed)
Pt with complaints of sneezing, watery eyes, and runny nose. Pt requested benadryl. On call MD notified. No new orders given.
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
## 2014-04-01 NOTE — Discharge Summary (Addendum)
Heather Preston, is a 33 y.o. female  DOB January 12, 1981  MRN [id number].  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 Kidney infection   . UTI (lower urinary tract infection)   . ADD (attention deficit disorder)   . Anxiety   . Depression     Past Surgical History  Procedure Laterality Date  . Urethra surgery       Discharge Condition: Stable   Follow UP  Follow-up Information   Follow up with [facility]    . Schedule an appointment as soon as possible for a visit on 04/08/2014. (Your PCP and Psychiatrist)    Contact information:   [address] [place] [address] [phone]        Discharge Instructions  and  Discharge Medications         Discharge Instructions   Diet - low sodium heart healthy    Complete by:  As directed      Discharge instructions    Complete by:  As directed   Follow with Primary MD  in 7 days   Get CBC, CMP  checked  by Primary MD next visit.    Activity: As tolerated with Full fall precautions use walker/cane & assistance as needed   Disposition  [facility]   Diet: Heart Healthy    For Heart failure patients - Check your Weight same time everyday, if you gain over 2 pounds, or you develop in leg swelling, experience more shortness of breath or chest pain, call your Primary MD immediately. Follow Cardiac Low Salt Diet and 1.8 lit/day fluid restriction.   On your next visit with her primary care physician please Get Medicines reviewed and adjusted.  Please request your Prim.MD to go over all Hospital Tests and Procedure/Radiological results at the follow up, please get all Hospital records sent to your Prim MD by signing hospital release before you go home.   If you experience worsening of your admission symptoms, develop shortness of breath, life threatening emergency, suicidal or homicidal thoughts you must seek medical attention immediately by calling 911 or calling your MD immediately  if symptoms less severe.  You Must read complete instructions/literature along with all the possible adverse reactions/side effects for all the Medicines you take and that have been prescribed to you. Take any new Medicines after you have completely understood and accpet all the possible adverse reactions/side  effects.   Do not drive and provide baby sitting services if your were admitted for syncope or siezures until you have seen by Primary MD or a Neurologist and advised to do so again.  Do not drive when taking Pain medications.    Do not take more than prescribed Pain, Sleep and Anxiety Medications  Special Instructions: If you have smoked or chewed Tobacco  in the last 2 yrs please stop smoking, stop any regular Alcohol  and or any Recreational drug use.  Wear Seat belts while driving.   Please note  You were cared for by a hospitalist during your hospital stay. If you have any questions about your discharge medications or the care you received while you were in the hospital after you are discharged, you can call the unit and asked to speak with  the hospitalist on call if the hospitalist that took care of you is not available. Once you are discharged, your primary care physician will handle any further medical issues. Please note that NO REFILLS for any discharge medications will be authorized once you are discharged, as it is imperative that you return to your primary care physician (or establish a relationship with a primary care physician if you do not have one) for your aftercare needs so that they can reassess your need for medications and monitor your lab values.  Follow with Primary MD  in 7 days   Get CBC, CMP  checked  by Primary MD next visit.    Activity: As tolerated with Full fall precautions use walker/cane & assistance as needed   Disposition [facility]   Diet: Heart Healthy    For Heart failure patients - Check your Weight same time everyday, if you gain over 2 pounds, or you develop in leg swelling, experience more shortness of breath or chest pain, call your Primary MD immediately. Follow Cardiac Low Salt Diet and 1.8 lit/day fluid restriction.   On your next visit with her primary care physician please Get Medicines reviewed and adjusted.  Please request your Prim.MD to go over all Hospital Tests and Procedure/Radiological results at the follow up, please get all Hospital records sent to your Prim MD by signing hospital release before you go home.   If you experience worsening of your admission symptoms, develop shortness of breath, life threatening emergency, suicidal or homicidal thoughts you must seek medical attention immediately by calling 911 or calling your MD immediately  if symptoms less severe.  You Must read complete instructions/literature along with all the possible adverse reactions/side effects for all the Medicines you take and that have been prescribed to you. Take any new Medicines after you have completely understood and accpet all the possible adverse reactions/side effects.   Do not drive and  provide baby sitting services if your were admitted for syncope or siezures until you have seen by Primary MD or a Neurologist and advised to do so again.  Do not drive when taking Pain medications.    Do not take more than prescribed Pain, Sleep and Anxiety Medications  Special Instructions: If you have smoked or chewed Tobacco  in the last 2 yrs please stop smoking, stop any regular Alcohol  and or any Recreational drug use.  Wear Seat belts while driving.   Please note  You were cared for by a hospitalist during your hospital stay. If you have any questions about your discharge medications or the care you received while you were in the hospital after you are discharged,  you can call the unit and asked to speak with the hospitalist on call if the hospitalist that took care of you is not available. Once you are discharged, your primary care physician will handle any further medical issues. Please note that NO REFILLS for any discharge medications will be authorized once you are discharged, as it is imperative that you return to your primary care physician (or establish a relationship with a primary care physician if you do not have one) for your aftercare needs so that they can reassess your need for medications and monitor your lab values.     Increase activity slowly    Complete by:  As directed             Medication List         ALPRAZolam 0.5 MG 24 hr tablet  Commonly known as:  XANAX XR  Take 1 tablet (0.5 mg total) by mouth daily.     amphetamine-dextroamphetamine 15 MG tablet  Commonly known as:  ADDERALL  Take 1 tablet (15 mg total) by mouth 2 (two) times daily.     cefUROXime 500 MG tablet  Commonly known as:  CEFTIN  Take 1 tablet (500 mg total) by mouth 2 (two) times daily with a meal. stop date 04-03-14     DSS 100 MG Caps  Take 100 mg by mouth 2 (two) times daily.     lamoTRIgine 100 MG tablet  Commonly known as:  LAMICTAL  Take 100 mg by mouth daily.      LINZESS 145 MCG Caps capsule  Generic drug:  Linaclotide  Take 145 mcg by mouth daily.     nicotine 21 mg/24hr patch  Commonly known as:  NICODERM CQ - dosed in mg/24 hours  Place 21 mg onto the skin daily as needed (to stop smoking).     Oxcarbazepine 300 MG tablet  Commonly known as:  TRILEPTAL  Take 3 tablets (900 mg total) by mouth at bedtime.     phenazopyridine 200 MG tablet  Commonly known as:  PYRIDIUM  Take 1 tablet (200 mg total) by mouth 3 (three) times daily with meals.     polyethylene glycol packet  Commonly known as:  MIRALAX / GLYCOLAX  Take 17 g by mouth 2 (two) times daily.          Diet and Activity recommendation: See Discharge Instructions above   Consults obtained - psych, critical care   Major procedures and Radiology Reports - PLEASE review detailed and final reports for all details, in brief -      No results found.  Micro Results     Recent Results (from the past 240 hour(s))  URINE CULTURE     Status: None   Collection Time    03/30/14  4:43 PM      Result Value Ref Range Status   Specimen Description URINE, CATHETERIZED   Final   Special Requests ADDED [id number]   Final   Culture  Setup Time     Final   Value: 03/30/2014 19:51     Performed at [facility]   Colony Count     Final   Value: >=100,000 COLONIES/ML     Performed at [facility]   Culture     Final   Value: ESCHERICHIA COLI     Performed at [facility]   Report Status 04/01/2014 FINAL   Final   Organism ID, Bacteria ESCHERICHIA COLI   Final  MRSA  PCR SCREENING     Status: None   Collection Time    03/30/14  7:27 PM      Result Value Ref Range Status   MRSA by PCR NEGATIVE  NEGATIVE Final   Comment:            The GeneXpert MRSA Assay (FDA     approved for NASAL specimens     only), is one component of a     comprehensive MRSA colonization     surveillance program. It is not     intended to diagnose MRSA     infection nor to guide  or     monitor treatment for     MRSA infections.     History of present illness and  Hospital Course:     Kindly see H&P for history of present illness and admission details, please review complete Labs, Consult reports and Test reports for all details in brief Heather Preston, is a 33 y.o. female, patient with history of urethral surgery, ADD, anxiety and depression, who was admitted on the pulmonary critical care service for intentional drug overdose, she took combination of Trileptal Xanax Neurontin and alcohol. She was initially hypotensive and somnolent, she initially required critical care admission. After supportive care with IV fluids.   After supportive care she is back to her baseline, currently not suicidal homicidal, or only complaint is mild constipation, she also had evidence of UTI this admission for which she has been placed on IV Rocephin, so far she is going Escherichia coli and sensitivity results are still pending. We'll place her on 2 more days of oral Ceftin. If she goes to [facility] hospital today we'll request the following physician to please monitor final sensitivity results and adjust medications if needed.    For underlying psych issues she will resume her home medications unchanged except as Xanax at a lower dose, will defer to psychiatry to adjust her psych medications further as needed.         Today   Subjective:   Lusine Corlett today has no headache,no chest abdominal pain,no new weakness tingling or numbness, feels much better.  Objective:   Blood pressure 118/81, pulse 78, temperature 98.2 F (36.8 C), temperature source Oral, resp. rate 18, height 5\' 3"  (1.6 m), weight 55.9 kg (123 lb 3.8 oz), SpO2 98.00%.   Intake/Output Summary (Last 24 hours) at 04/01/14 1629 Last data filed at 04/01/14 1100  Gross per 24 hour  Intake    512 ml  Output      0 ml  Net    512 ml    Exam Awake Alert, Oriented x 3, No new F.N deficits, Normal  affect [place].AT,PERRAL Supple Neck,No JVD, No cervical lymphadenopathy appriciated.  Symmetrical Chest wall movement, Good air movement bilaterally, CTAB RRR,No Gallops,Rubs or new Murmurs, No Parasternal Heave +ve B.Sounds, Abd Soft, Non tender, No organomegaly appriciated, No rebound -guarding or rigidity. No Cyanosis, Clubbing or edema, No new Rash or bruise  Data Review   CBC w Diff: Lab Results  Component Value Date   WBC 6.4 03/31/2014   HGB 10.4* 03/31/2014   HCT 31.9* 03/31/2014   PLT 266 03/31/2014   LYMPHOPCT 42 12/24/2013   MONOPCT 5 12/24/2013   EOSPCT 1 12/24/2013   BASOPCT 0 12/24/2013    CMP: Lab Results  Component Value Date   NA 145 03/31/2014   K 4.3 03/31/2014   CL 113* 03/31/2014   CO2 23 03/31/2014  BUN 8 03/31/2014   CREATININE 0.54 03/31/2014   PROT 5.3* 03/31/2014   ALBUMIN 2.8* 03/31/2014   BILITOT <0.2* 03/31/2014   ALKPHOS 72 03/31/2014   AST 25 03/31/2014   ALT 18 03/31/2014  .   Total Time in preparing paper work, data evaluation and todays exam - 35 minutes  Leroy SeaSINGH,PRASHANT K M.D on 04/01/2014 at 4:29 PM  [facility] Office  [phone]   **Disclaimer: This note may have been dictated with voice recognition software. Similar sounding words can inadvertently be transcribed and this note may contain transcription errors which may not have been corrected upon publication of note.**

## 2014-04-01 NOTE — Progress Notes (Signed)
Denzil MagnusonKrista Eberle to be D/C'd Behavioral Health per MD order.  Discussed with the patient and all questions fully answered.    Medication List         ALPRAZolam 0.5 MG 24 hr tablet  Commonly known as:  XANAX XR  Take 1 tablet (0.5 mg total) by mouth daily.     amphetamine-dextroamphetamine 15 MG tablet  Commonly known as:  ADDERALL  Take 1 tablet (15 mg total) by mouth 2 (two) times daily.     cefUROXime 500 MG tablet  Commonly known as:  CEFTIN  Take 1 tablet (500 mg total) by mouth 2 (two) times daily with a meal. stop date 04-03-14     DSS 100 MG Caps  Take 100 mg by mouth 2 (two) times daily.     lamoTRIgine 100 MG tablet  Commonly known as:  LAMICTAL  Take 100 mg by mouth daily.     LINZESS 145 MCG Caps capsule  Generic drug:  Linaclotide  Take 145 mcg by mouth daily.     nicotine 21 mg/24hr patch  Commonly known as:  NICODERM CQ - dosed in mg/24 hours  Place 21 mg onto the skin daily as needed (to stop smoking).     Oxcarbazepine 300 MG tablet  Commonly known as:  TRILEPTAL  Take 3 tablets (900 mg total) by mouth at bedtime.     polyethylene glycol packet  Commonly known as:  MIRALAX / GLYCOLAX  Take 17 g by mouth 2 (two) times daily.        VVS, Skin clean, dry and intact without evidence of skin break down, no evidence of skin tears noted. IV catheter discontinued intact. Site without signs and symptoms of complications. Dressing and pressure applied.  An After Visit Summary was printed and given to the patient.  D/c education completed with patient/family including follow up instructions, medication list, d/c activities limitations if indicated, with other d/c instructions as indicated by MD - patient able to verbalize understanding, all questions fully answered.   Patient instructed to return to ED, call 911, or call MD for any changes in condition.   [facility] [place] [facility]  Aldean AstLEsperance, Darrow Barreiro C 04/01/2014 2:52  PM

## 2014-04-01 NOTE — BH Assessment (Signed)
[facility] Assessment Progress Note  At 17:10 this Clinical research associatewriter received a call from Heather ReadValerie Vestal, [facility].  Her cell phone number is [phone].  She works with  Heather Seahristopher Aiken, MD.  She calls, not requesting patient information, but volunteering information about the medications that she has been prescribing for the pt.  They are as follows: Heather Preston 300 mg, 3 tabs PO QHS Heather Preston 100 mg, 1 tab PO Daily Heather Preston 1 mg, 1 tab up to TID PRN anxiety Heather Preston 15 mg, 1 tab PO QAM, and 1 tab PO Q 14:00; she specifies that this is not Preston  Heather Canninghomas Estle Sabella, MA Triage Specialist 04/01/2014 @ 17:19

## 2014-04-01 NOTE — Tx Team (Signed)
Initial Interdisciplinary Treatment Plan  PATIENT STRENGTHS: (choose at least two) Ability for insight Active sense of humor Average or above average intelligence Capable of independent living General fund of knowledge  PATIENT STRESSORS: Financial difficulties Health problems   PROBLEM LIST: Problem List/Patient Goals Date to be addressed Date deferred Reason deferred Estimated date of resolution  depression 04-01-14   dc  Suicide risk 04-01-14   dc                                             DISCHARGE CRITERIA:  Ability to meet basic life and health needs Improved stabilization in mood, thinking, and/or behavior Medical problems require only outpatient monitoring Need for constant or close observation no longer present Reduction of life-threatening or endangering symptoms to within safe limits Safe-care adequate arrangements made  PRELIMINARY DISCHARGE PLAN: Attend aftercare/continuing care group Outpatient therapy Return to previous living arrangement  PATIENT/FAMIILY INVOLVEMENT: This treatment plan has been presented to and reviewed with the patient, Heather Preston, and/or family member, .  The patient and family have been given the opportunity to ask questions and make suggestions.  Heather Preston, Heather Preston 04/01/2014, 6:35 PM

## 2014-04-02 ENCOUNTER — Encounter (HOSPITAL_COMMUNITY): Payer: Self-pay | Admitting: Psychiatry

## 2014-04-02 MED ORDER — FLEET ENEMA 7-19 GM/118ML RE ENEM
1.0000 | ENEMA | Freq: Once | RECTAL | Status: AC
  Administered 2014-04-02: 1 via RECTAL
  Filled 2014-04-02: qty 1
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
## 2014-04-02 NOTE — ED Provider Notes (Signed)
This patient was seen in conjunction with Dr. McLennan (the resident physician). The Roxy Cedardocumentation accurately reflects the patients ED evaluation and management with the following clarifications.  On my exam the patient was somnolent.  The mother provided much of the HPI.  The patient has a long Hx of depression versus bipolar disease with anxiety.  She has prior hospitalization and recently had a stressful life situation (moving).  After discussion with poison control, patient received IVF, had continuous monitoring.  She kept a patent airway throughout her ED course, but remained somnolent as well.  She required ICU placement due to decreased mental status, and overdose with multiple meds.  Psych consult anticipated when more medically stable.  ECG: sr, 67, normal  O2- 99%ra, nml  CRITICAL CARE Performed by: Gerhard MunchLOCKWOOD, Ginni Eichler Total critical care time: 40 Critical care time was exclusive of separately billable procedures and treating other patients. Critical care was necessary to treat or prevent imminent or life-threatening deterioration. Critical care was time spent personally by me on the following activities: development of treatment plan with patient and/or surrogate as well as nursing, discussions with consultants, evaluation of patient's response to treatment, examination of patient, obtaining history from patient or surrogate, ordering and performing treatments and interventions, ordering and review of laboratory studies, ordering and review of radiographic studies, pulse oximetry and re-evaluation of patient's condition.   Gerhard Munchobert Wisdom Rickey, MD 04/02/14 [phone]

## 2014-04-02 NOTE — Progress Notes (Addendum)
Patient ID: Denzil MagnusonKrista Kalt, female   DOB: 05/09/1981, 33 y.o.   MRN: [id number]  D: Pt has been very flat and depressed on the unit today, she reports being in a lot of pain due to constipation. Pt reports also that she was concerned that she was not put back on her medication, Renata Capriceonrad NP made aware new orders noted.  Pt reported being negative SI/HI, no AH/VH noted. A: 15 min checks continued for patient safety. R: Pt safety maintained.

## 2014-04-02 NOTE — [facility] Group Notes (Signed)
[facility] Group Notes:  (Clinical Social Work)  04/02/2014   1:15-2:45PM  Summary of Progress/Problems:   The main focus of today's process group was for the patient to identify ways in which they have sabotaged their own mental health wellness/recovery.  Motivational interviewing and a handout were used to explore the benefits and costs of their self-sabotaging behavior as well as the benefits and costs of changing this behavior.  The Stages of Change were explained to the group using a handout, and patients identified where they are with regard to changing self-defeating behaviors.  The patient missed the first half of group.  When she did join later, she participated fully in the discussion.  She talked about how she has good supports in her parents, but they have tended to not interfere until things were really bad for her, because they lead their own busy lives.  She is now living across the street from them and they are being more hands-on supports than previously.  Type of Therapy:  Process Group  Participation Level:  Active  Participation Quality:  Attentive and Sharing  Affect:  Anxious and Blunted  Cognitive:  Alert and Oriented  Insight:  Developing/Improving  Engagement in Therapy:  Engaged  Modes of Intervention:  Education, Motivational Interviewing   Ambrose MantleMareida Grossman-Orr, LCSW 04/02/2014, 4:00pm

## 2014-04-02 NOTE — [facility] Group Notes (Signed)
Adult Psychoeducational Group Note  Date:  04/02/2014 Time:  10:17 PM  Group Topic/Focus:  Wrap-Up Group:   The focus of this group is to help patients review their daily goal of treatment and discuss progress on daily workbooks.  Participation Level:  Active  Participation Quality:  Appropriate  Affect:  Appropriate  Cognitive:  Appropriate  Insight: Appropriate  Engagement in Group:  Engaged  Modes of Intervention:  Discussion  Additional Comments:  Dot LanesKrista stated her day was good.  She has been staying around people and not isolating herself.  She stated that she got a visit from her dad which ment a lot to her.  Caroll RancherLindsay, Dayvian Blixt A 04/02/2014, 10:17 PM

## 2014-04-02 NOTE — [facility] Suicide Risk Assessment (Signed)
   Nursing information obtained from:    Demographic factors:    Current Mental Status:    Loss Factors:    Historical Factors:    Risk Reduction Factors:    Total Time spent with patient: 30 minutes  CLINICAL FACTORS:   Severe Anxiety and/or Agitation Bipolar Disorder:   Mixed State Depression:   Aggression Anhedonia Hopelessness Impulsivity Insomnia Unstable or Poor Therapeutic Relationship  Psychiatric Specialty Exam: Physical Exam  Psychiatric: Her speech is normal. Thought content normal. Her mood appears anxious. She is agitated. Cognition and memory are normal. She expresses impulsivity. She exhibits a depressed mood.    Review of Systems  Constitutional: Negative.   HENT: Negative.   Eyes: Negative.   Respiratory: Negative.   Cardiovascular: Negative.   Gastrointestinal: Negative.   Genitourinary: Negative.   Musculoskeletal: Negative.   Skin: Negative.   Neurological: Negative.   Endo/Heme/Allergies: Negative.   Psychiatric/Behavioral: Positive for depression and suicidal ideas. The patient is nervous/anxious and has insomnia.     Blood pressure 110/78, pulse 92, temperature 98.1 F (36.7 C), temperature source Oral, resp. rate 16, height 5\' 4"  (1.626 m), weight 60.328 kg (133 lb).Body mass index is 22.82 kg/(m^2).  General Appearance: Fairly Groomed  Patent attorneyye Contact::  Good  Speech:  Clear and Coherent  Volume:  Normal  Mood:  Anxious and Depressed  Affect:  Appropriate  Thought Process:  Goal Directed  Orientation:  Full (Time, Place, and Person)  Thought Content:  Negative  Suicidal Thoughts:  No  Homicidal Thoughts:  No  Memory:  Immediate;   Fair Recent;   Fair Remote;   Fair  Judgement:  Impaired  Insight:  Lacking  Psychomotor Activity:  Normal  Concentration:  Fair  Recall:  Good  Fund of Knowledge:Good  Language: Good  Akathisia:  No  Handed:  Right  AIMS (if indicated):     Assets:  Communication Skills Desire for Improvement Physical  Health  Sleep:  Number of Hours: 5.75   Musculoskeletal: Strength & Muscle Tone: within normal limits Gait & Station: normal Patient leans: N/A  COGNITIVE FEATURES THAT CONTRIBUTE TO RISK:  Closed-mindedness Polarized thinking    SUICIDE RISK:   Minimal: No identifiable suicidal ideation.  Patients presenting with no risk factors but with morbid ruminations; may be classified as minimal risk based on the severity of the depressive symptoms  PLAN OF CARE:1. Admit for crisis management and stabilization. 2. Medication management to reduce current symptoms to base line and improve the     patient's overall level of functioning 3. Treat health problems as indicated. 4. Develop treatment plan to decrease risk of relapse upon discharge and the need for     readmission. 5. Psycho-social education regarding relapse prevention and self care. 6. Health care follow up as needed for medical problems. 7. Restart home medications where appropriate.   I certify that inpatient services furnished can reasonably be expected to improve the patient's condition.  Thedore MinsAkintayo, Shaleka Brines, MD 04/02/2014, 10:20 AM

## 2014-04-02 NOTE — [facility] Group Notes (Addendum)
[facility] Group Notes:  (Nursing/MHT/Case Management/Adjunct)  Date:  04/02/2014  Time:  3:12 PM  Type of Therapy:  Psychoeducational Skills  Participation Level:  Active  Participation Quality:  Appropriate  Affect:  Appropriate  Cognitive:  Appropriate  Insight:  Appropriate  Engagement in Group:  Engaged  Modes of Intervention:  Discussion  Summary of Progress/Problems: Pt did attend self inventory group, pt reported that she was negative SI/HI, no AH/VH noted. Pt rated her depression as a 5, and her helplessness/hopelessness as a 10.     Pt reported concerns about being extremely constipated and not being put back on all of her prescribed medication, pt advised that the doctor will be made aware.   Jacquelyne BalintForrest, Cailan Antonucci Shanta 04/02/2014, 3:12 PM

## 2014-04-02 NOTE — [facility] Counselor (Signed)
Adult Psychosocial Assessment Update Interdisciplinary Team  Previous Behavior Health Hospital admissions/discharges:  Admissions Discharges  Date:  11/01/13 Date:  11/04/13  Date: Date:  Date: Date:  Date: Date:  Date: Date:   Changes since the last Psychosocial Assessment (including adherence to outpatient mental health and/or substance abuse treatment, situational issues contributing to decompensation and/or relapse). Dot LanesKrista now has a psychiatrist that she is very comfortable with, and she feels confident in her current meds.  Is seeing Darlyn ReadValerie [facility]al [facility] [place].  Has an appointment set up for next month.  [place] therapist [place],  Christain SacramentoJane Lessard,  regularly.  Dot LanesKrista states she found a job after her last hospitalization, but put in her notice and left due to anxiety and panic attacks.    She just moved, and even though it was a positive change, the stress of the change impacted her negatively and resulted in her current hospitalization.  Will be living by herself, with her parents across the street.  They are becoming more aware of the severity of her illness and her need for help, and will call her therapist and psychiatrist as needed.         Discharge Plan 1. Will you be returning to the same living situation after discharge?   Yes:  XX No:      If no, what is your plan?    Just moved prior to this hospitalization to a place across the street from her parents, will be living alone.       2. Would you like a referral for services when you are discharged? Yes:     If yes, for what services?  No:   XX    Dot LanesKrista has providers and appointments in place.  Darlyn ReadValerie Vestal, NP at the [facility]. Her cell phone number is [phone].    Christain [place]Jane Lessard, [place], is therapist.  [phone]     Summary and Recommendations (to be completed by the evaluator) This is a 33yo Caucasian female admitted for an intentional  overdose.  Since her last hospitalization, she feels like she is on the right medications, has a good therapist and good psychiatrist in place.  She just moved into a place across the street from her parents and is getting increased support from them.  She would benefit from safety monitoring, medication evaluation, psychoeducation, group therapy, and discharge planning to link with ongoing resources.                        Signature:  Sarina SerGrossman-Orr, Illyanna Petillo Jo, 04/02/2014 2:50 PM

## 2014-04-03 DIAGNOSIS — F909 Attention-deficit hyperactivity disorder, unspecified type: Secondary | ICD-10-CM

## 2014-04-03 DIAGNOSIS — T50902A Poisoning by unspecified drugs, medicaments and biological substances, intentional self-harm, initial encounter: Secondary | ICD-10-CM

## 2014-04-03 DIAGNOSIS — T50901A Poisoning by unspecified drugs, medicaments and biological substances, accidental (unintentional), initial encounter: Secondary | ICD-10-CM

## 2014-04-03 DIAGNOSIS — F3160 Bipolar disorder, current episode mixed, unspecified: Secondary | ICD-10-CM

## 2014-04-03 MED ORDER — NICOTINE POLACRILEX 2 MG MT GUM
2.0000 mg | CHEWING_GUM | OROMUCOSAL | Status: DC | PRN
  Administered 2014-04-03: 2 mg via ORAL

## 2014-04-03 MED ORDER — HYDROCORTISONE 1 % EX CREA
TOPICAL_CREAM | Freq: Four times a day (QID) | CUTANEOUS | Status: DC
  Administered 2014-04-03: 12:00:00 via TOPICAL
  Filled 2014-04-03 (×2): qty 28

## 2014-04-03 MED ORDER — HYDROXYZINE HCL 50 MG PO TABS
50.0000 mg | ORAL_TABLET | Freq: Once | ORAL | Status: AC
  Administered 2014-04-03: 50 mg via ORAL
  Filled 2014-04-03 (×2): qty 1

## 2014-04-03 MED ORDER — AMPHETAMINE-DEXTROAMPHET ER 5 MG PO CP24: 15.0000 mg | ORAL_CAPSULE | Freq: Every day | ORAL | Status: DC

## 2014-04-03 MED ORDER — NICOTINE POLACRILEX 2 MG MT GUM
CHEWING_GUM | OROMUCOSAL | Status: AC
  Filled 2014-04-03: qty 1

## 2014-04-03 MED ORDER — NICOTINE 21 MG/24HR TD PT24
21.0000 mg | MEDICATED_PATCH | Freq: Every day | TRANSDERMAL | Status: DC
  Administered 2014-04-04 – 2014-04-06 (×3): 21 mg via TRANSDERMAL
  Filled 2014-04-03 (×7): qty 1

## 2014-04-03 MED ORDER — HYDROXYZINE HCL 25 MG PO TABS
25.0000 mg | ORAL_TABLET | Freq: Four times a day (QID) | ORAL | Status: DC | PRN
  Administered 2014-04-03 – 2014-04-06 (×4): 25 mg via ORAL
  Filled 2014-04-03 (×4): qty 1

## 2014-04-03 NOTE — [facility] Group Notes (Signed)
Adult Psychoeducational Group Note  Date:  04/03/2014 Time:  9:20 PM  Group Topic/Focus:  Wrap-Up Group:   The focus of this group is to help patients review their daily goal of treatment and discuss progress on daily workbooks.  Participation Level:  Active  Participation Quality:  Appropriate and Attentive  Affect:  Appropriate  Cognitive:  Alert and Appropriate  Insight: Appropriate  Engagement in Group:  Engaged  Modes of Intervention:  Discussion, Education and Support  Additional Comments:  Pt rated her day at a 4 out of 62. Pt stated she did not sleep well and has had some anxiety. Pt's goal was to express her concerns about treatment and to be upfront with her treatment care providers. Pt stated in the past she has held things back because she felt like her needs would not be met or she would say the wrong thing, which would cause her anxiety. Pt has felt comfortable with the PA on the unit this weekend and will be informative with support staff during the week.   Rocky Crafts 04/03/2014, 9:20 PM

## 2014-04-03 NOTE — Progress Notes (Signed)
D: Pt has been appropriate on the unit today, pt reported that she felt much better. Pt reported that she was starting to feel better about herself. Pt reported that her depression was a 5, and that her hopelessness was a 5. Pt reported being negative SI/HI, no AH/VH noted.  A: 15 min checks continued for patient safety. R: Pt safety maintained.

## 2014-04-03 NOTE — Progress Notes (Signed)
Patient ID: Heather MagnusonKrista Preston, female   DOB: 05/17/1981, 33 y.o.   MRN: [id number] [facility]  04/03/2014 12:38 PM Heather Preston  MRN:  [id number] Subjective:  Pt seen and chart reviewed. Pt denies SI, HI, and AVH, contracts for safety. Pt requests that her home medications be restarted (Adderall 15mg  bid). Pt affirmed agreement with having a half dose of this (15mg  daily) due to potential interaction with other medications. Pt is active on the unit and also with group therapy participation, but is asking multiple people for medication adjustments.   Diagnosis:   DSM5: Schizophrenia Disorders:   Obsessive-Compulsive Disorders:   Trauma-Stressor Disorders:   Substance/Addictive Disorders:   Depressive Disorders:  Disruptive Mood Dysregulation Disorder (296.99)  Axis I: ADHD, combined type and Bipolar, mixed  ADL's:  Intact  Sleep: Fair  Appetite:  Fair  Suicidal Ideation:  Patient had suicide attempt with multiple medication overdose but contracts for safety while in hospital  Homicidal Ideation:  Denied  AEB (as evidenced by):  Psychiatric Specialty Exam: ROS  Blood pressure 91/66, pulse 120, temperature 98.1 F (36.7 C), temperature source Oral, resp. rate 18, height 5\' 3"  (1.6 m), weight 51.256 kg (113 lb), SpO2 97.00%.Body mass index is 20.02 kg/(m^2).  General Appearance: Guarded  Eye Contact::  Fair  Speech:  Clear and Coherent  Volume:  Decreased  Mood:  Anxious  Affect:  Appropriate  Thought Process:  Goal Directed and Intact  Orientation:  Full (Time, Place, and Person)  Thought Content:  WDL  Suicidal Thoughts:  No  Homicidal Thoughts:  No  Memory:  Immediate;   Fair  Judgement:  Impaired  Insight:  Lacking  Psychomotor Activity:  Psychomotor Retardation  Concentration:  Fair  Recall:  Fair  Akathisia:  NA  Handed:  Right  AIMS (if indicated):     Assets:  Communication Skills Desire for Improvement Financial Resources/Insurance Housing Physical  Health Resilience Social Support Transportation  Sleep:  Number of Hours: 3   Current Medications: Current Facility-Administered Medications  Medication Dose Route Frequency Provider Last Rate Last Dose  . acetaminophen (TYLENOL) tablet 650 mg  650 mg Oral Q6H PRN Nehemiah SettleJanardhaha R Jonnalagadda, MD      . alum & mag hydroxide-simeth (MAALOX/MYLANTA) 200-200-20 MG/5ML suspension 30 mL  30 mL Oral Q4H PRN Nehemiah SettleJanardhaha R Jonnalagadda, MD      . cefUROXime (CEFTIN) tablet 500 mg  500 mg Oral BID WC Nehemiah SettleJanardhaha R Jonnalagadda, MD   500 mg at 04/03/14 0904  . clonazePAM (KLONOPIN) tablet 0.5 mg  0.5 mg Oral Daily Nehemiah SettleJanardhaha R Jonnalagadda, MD   0.5 mg at 04/03/14 0904  . docusate sodium (COLACE) capsule 100 mg  100 mg Oral BID Nehemiah SettleJanardhaha R Jonnalagadda, MD   100 mg at 04/01/14 2127  . hydrocortisone cream 1 %   Topical QID Kristeen MansFran E Hobson, NP      . lamoTRIgine (LAMICTAL) tablet 100 mg  100 mg Oral Daily Nehemiah SettleJanardhaha R Jonnalagadda, MD   100 mg at 04/03/14 0904  . Linaclotide (LINZESS) capsule 145 mcg  145 mcg Oral Daily Nehemiah SettleJanardhaha R Jonnalagadda, MD   145 mcg at 04/03/14 0904  . magnesium hydroxide (MILK OF MAGNESIA) suspension 30 mL  30 mL Oral Daily PRN Nehemiah SettleJanardhaha R Jonnalagadda, MD      . nicotine polacrilex (NICORETTE) gum 2 mg  2 mg Oral PRN Kristeen MansFran E Hobson, NP   2 mg at 04/03/14 0909  . Oxcarbazepine (TRILEPTAL) tablet 900 mg  900 mg Oral QHS Tomasita CrumbleJanardhaha  Filbert Schilder Jonnalagadda, MD   900 mg at 04/02/14 2150  . phenazopyridine (PYRIDIUM) tablet 200 mg  200 mg Oral TID WC Nehemiah SettleJanardhaha R Jonnalagadda, MD   200 mg at 04/03/14 1212  . polyethylene glycol (MIRALAX / GLYCOLAX) packet 17 g  17 g Oral BID Nehemiah SettleJanardhaha R Jonnalagadda, MD   17 g at 04/01/14 2128    Lab Results:  No results found for this or any previous visit (from the past 48 hour(s)).  Physical Findings: AIMS:  , ,  ,  ,    CIWA:    COWS:     Treatment Plan Summary: Daily contact with patient to assess and evaluate symptoms and progress in  treatment Medication management  Plan: Treatment Plan/Recommendations:   1. Admit for crisis management and stabilization. 2. Medication management to reduce current symptoms to base line and improve the patient's overall level of functioning. Continue her current medications.  Encouraged to be compliant with medications and therapies  -Adderall 15mg  XR Daily (verified as 15mg  bid, will start with half dose to assess concomitant effects).  3. Treat health problems as indicated. 4. Develop treatment plan to decrease risk of relapse upon discharge and to reduce the need for readmission. 5. Psycho-social education regarding relapse prevention and self care. 6. Health care follow up as needed for medical problems. 7. Restart home medications where appropriate. 8. Disposition plans are in progress may be discharged tomorrow if she continues to contract for safety, if not she needed involuntary commitment.   Medical Decision Making Problem Points:  Established problem, stable/improving (1), New problem, with no additional work-up planned (3), Review of last therapy session (1) and Review of psycho-social stressors (1) Data Points:  Review or order clinical lab tests (1) Review or order medicine tests (1) Review of medication regiment & side effects (2) Review of new medications or change in dosage (2)  I certify that inpatient services furnished can reasonably be expected to improve the patient's condition.   Beau FannyWithrow, John C, FNP-BC 04/03/2014, 12:38 PM  Patient seen, evaluated and I agree with notes by Nurse Practitioner. Thedore MinsMojeed Lilymae Swiech, MD

## 2014-04-03 NOTE — [facility] Group Notes (Signed)
[facility] Group Notes:  (Clinical Social Work)  04/03/2014   1:15-2:15PM  Summary of Progress/Problems:  The main focus of today's process group was to   identify the patient's current support system and decide on other supports that can be put in place.  The picture on workbook was used to discuss why additional supports are needed.  An emphasis was placed on using counselor, doctor, therapy groups, 12-step groups, and problem-specific support groups to expand supports.   There was also an extensive discussion about what constitutes a healthy support versus an unhealthy support.  The patient expressed full comprehension of the concepts presented, and agreed that there is a need to add more supports.  She expressed that she is a people pleaser and asked whether certain things that she does for people and how they act toward her makes them unhealthy supports.  She admitted that she knows already that these people are unhealthy supports, but she feels that is the only kind she has in her life except for her parents.  Type of Therapy:  Process Group  Participation Level:  Active  Participation Quality:  Attentive and Sharing  Affect:  Blunted, Anxious  Cognitive:  Appropriate and Oriented  Insight:  Developing/Improving  Engagement in Therapy:  Engaged  Modes of Intervention:  Education,  Support and [employer]Processing  Clea Dubach Grossman-Orr, LCSW 04/03/2014, 4:00pm

## 2014-04-03 NOTE — Progress Notes (Signed)
Patient ID: Denzil MagnusonKrista Buchta, female   DOB: 07/09/1981, 33 y.o.   MRN: [id number] D)   Has been interacting appropriately with staff and peers, seems superficially bright, smiling, but somewhat anxious.  Attended group this evening, and participated.  Came to med window afterward and c/o itching at site of nicoderm patch on rt arm, was going to shower and wash site.  Was given cold pack for comfort.  NP was later notified after she woke up with c/o site burning and itching, orders were obtained.   A)  Will continue to monitor for safety, continue POC R)  Safety maintained.

## 2014-04-04 DIAGNOSIS — F329 Major depressive disorder, single episode, unspecified: Secondary | ICD-10-CM

## 2014-04-04 DIAGNOSIS — Z8659 Personal history of other mental and behavioral disorders: Secondary | ICD-10-CM

## 2014-04-04 MED ORDER — CLONAZEPAM 0.5 MG PO TABS
0.5000 mg | ORAL_TABLET | Freq: Two times a day (BID) | ORAL | Status: DC
  Administered 2014-04-04 – 2014-04-05 (×2): 0.5 mg via ORAL
  Filled 2014-04-04: qty 1

## 2014-04-04 MED ORDER — AMPHETAMINE-DEXTROAMPHET ER 5 MG PO CP24
15.0000 mg | ORAL_CAPSULE | Freq: Every day | ORAL | Status: DC
  Administered 2014-04-04 – 2014-04-06 (×3): 15 mg via ORAL
  Filled 2014-04-04 (×2): qty 3

## 2014-04-04 MED ORDER — FLEET ENEMA 7-19 GM/118ML RE ENEM
1.0000 | ENEMA | Freq: Once | RECTAL | Status: AC
  Administered 2014-04-04: 1 via RECTAL
  Filled 2014-04-04: qty 1

## 2014-04-04 MED ORDER — CLONAZEPAM 0.5 MG PO TABS
ORAL_TABLET | ORAL | Status: AC
  Filled 2014-04-04: qty 1

## 2014-04-04 NOTE — [facility] Suicide Risk Assessment (Signed)
[facility] INPATIENT:  Family/Significant Other Suicide Prevention Education  Suicide Prevention Education:  Education Completed; Barton DuboisDavid Carey - father [phone]),  (name of family member/significant other) has been identified by the patient as the family member/significant other with whom the patient will be residing, and identified as the person(s) who will aid the patient in the event of a mental health crisis (suicidal ideations/suicide attempt).  With written consent from the patient, the family member/significant other has been provided the following suicide prevention education, prior to the and/or following the discharge of the patient.  The suicide prevention education provided includes the following:  Suicide risk factors  Suicide prevention and interventions  National Suicide Hotline telephone number  [facility] assessment telephone number  [facility] Emergency Assistance 911  [facility] and/or Residential Mobile Crisis Unit telephone number  Request made of family/significant other to:  Remove weapons (e.g., guns, rifles, knives), all items previously/currently identified as safety concern.    Remove drugs/medications (over-the-counter, prescriptions, illicit drugs), all items previously/currently identified as a safety concern.  The family member/significant other verbalizes understanding of the suicide prevention education information provided.  The family member/significant other agrees to remove the items of safety concern listed above.  Carmina MillerHorton, Sascha Palma Nicole 04/04/2014, 11:37 AM
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
## 2014-04-04 NOTE — Progress Notes (Signed)
Adult Psychoeducational Group Note  Date:  04/04/2014 Time:  9:58 PM  Group Topic/Focus:  Goals Group:   The focus of this group is to help patients establish daily goals to achieve during treatment and discuss how the patient can incorporate goal setting into their daily lives to aide in recovery.  Participation Level:  Active  Participation Quality:  Appropriate  Affect:  Appropriate  Cognitive:  Appropriate  Insight: Appropriate  Engagement in Group:  Engaged  Modes of Intervention:  Discussion  Additional Comments:  Pt stated that the staff and the people here are making he stay good so far.  Terie PurserParker, Shalik Sanfilippo R 04/04/2014, 9:58 PM

## 2014-04-04 NOTE — [facility] Group Notes (Signed)
[facility] Note   04/04/2014 8:45 AM  Participation Quality:  Alert, Appropriate and Oriented  Mood/Affect:  Calm  Depression Rating:  2  Anxiety Rating:  7-8  Thoughts of Suicide:  Pt denies SI/HI  Will you contract for safety?   Yes  Current AVH:  Pt denies  Plan for Discharge/Comments:  Pt attended discharge planning group and actively participated in group.  CSW provided pt with today's workbook.  Pt reports feeling better today.  Pt states that when she returns home she plans to not isolate and find supportive friends.  Pt states that [place] where she has family support and has outpatient providers [facility] [place] and a private therapist.  CSW will secure pt's follow up.  No further needs voiced by pt at this time.    Transportation Means: Pt reports access to transportation - father will pick pt up  Supports: Pt states that her parents are supportive  Reyes IvanChelsea Horton, LCSW 04/04/2014 10:08 AM
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
## 2014-04-04 NOTE — Progress Notes (Signed)
Patient ID: Denzil MagnusonKrista Preston, female   DOB: 01/28/1981, 33 y.o.   MRN: [id number] D)  Has been out and about on the hall this evening, has been social and seen laughing with peers in the dayroom, attended group.  Came to med window afterward for hs meds and said she wanted her klonopin, since she hadn't taken it today, but MAR indicated she had had it this morning.  She became upset and denied that she had taken it, but the pyxis also verified that it had been pulled.  Stated she was going to make sure that tomorrow it is ordered tid, felt staff was not honest , then apologized .  Voiced that she didn't need her meds adjusted and that they had been changed, and was upset .  Requested vistaril for itching on her arms d/t nicoderm patch. A)  Order obtained for vistaril, will continue to monitor for safety, continue POC R)  Safety maintained.

## 2014-04-04 NOTE — Progress Notes (Signed)
D: Pt. Interacting with staff and peers appropriately on unit.  Pt. Denies SI, HI and AVH.  A: Will continue to monitor for safety. Pt. Given emotional support from RN. Pt. Medication routine continued. Pt. Encouraged to come to me with questions.   R: Pt. Remains appropriate and cooperative. Will continue to monitor pt. For safety. 

## 2014-04-04 NOTE — Progress Notes (Signed)
Pt is bright in affect and anxious in mood. Pt reports having anxiety at a level 9 out of 10. Pt has a few med change suggestions that she is interested in. She is interested in having her Adderall ordered for BID instead of the daily current dose. She reports that her Adderall works perfect in managing her concentration and anxiety. She also desires that her Klonopin is available at night. She currently reports that her scheduled dose of Klonopin in the morning is not needed. She has been declining her morning dose of Klonopin. She says a prn dose maybe better suited for her. Pt encouraged to mention these suggestions with her provider. Pt was on Adderrall BID prior to admission. Pt has been observed for active participation within the milieu. Pt currently denies any SI/HI/AVH.  A: Writer administered scheduled and prn medications to pt. Continued support and availability as needed was extended to this pt. Staff continue to monitor pt with q4115min checks.  R: No adverse drug reactions noted. Pt receptive to treatment. Pt remains safe at this time.

## 2014-04-04 NOTE — Progress Notes (Signed)
Patient ID: Heather MagnusonKrista Preston, female   DOB: 12/07/1980, 33 y.o.   MRN: [id number]  [facility]  04/04/2014 5:20 PM Heather MagnusonKrista Preston  MRN:  [id number] Subjective: " I'm better , but still pretty anxious" Objective: Reports improvement compared to admission, but still feels quite anxious, and at times feels as if she is about to have a panic attack. At this time emphasizes anxiety symptoms over depression or neuro-vegetative symptoms. Denies any current SI.  Denies any side effects on medications. Currently on Trileptal, low dose Klonopin, Lamictal, Adderall . Of note , also on Abx, which she states is for a recently diagnosed UTI, but is currently denying active symptoms or fever. Behavior on unit in good control- going to groups, no disruptive behaviors./   Diagnosis:  MDD was initial/admit diagnosis. Patient also reports history of PTSD and of ADHD.       ADL's:  Intact  Sleep: good   Appetite: good   Suicidal Ideation:  Patient had suicide attempt with multiple medication overdose but contracts for safety while in hospital  Homicidal Ideation:  Denied  AEB (as evidenced by):  Psychiatric Specialty Exam: Review of Systems  Constitutional: Negative for fever and chills.  Genitourinary: Negative for dysuria and urgency.  Psychiatric/Behavioral: Positive for depression. Negative for suicidal ideas. The patient is nervous/anxious.     Blood pressure 112/81, pulse 118, temperature 97.7 F (36.5 C), temperature source Oral, resp. rate 18, height 5\' 3"  (1.6 m), weight 51.256 kg (113 lb), SpO2 97.00%.Body mass index is 20.02 kg/(m^2).  General Appearance: well groomed   Patent attorneyye Contact::  Fair  Speech:  Clear and Coherent  Volume:  Decreased  Mood:  Anxious  Affect:  Appropriate, pleasant   Thought Process:  Goal Directed and Intact  Orientation:  Full (Time, Place, and Person)  Thought Content:  No psychotic symptoms   Suicidal Thoughts:  No  Homicidal Thoughts:  No  Memory:   Immediate;   Fair  Judgement:  Impaired  Insight:  Lacking  Psychomotor Activity:  Psychomotor Retardation  Concentration:  Fair  Recall:  Fair  Akathisia:  NA  Handed:  Right  AIMS (if indicated):     Assets:  Communication Skills Desire for Improvement Financial Resources/Insurance Housing Physical Health Resilience Social Support Transportation  Sleep:  Number of Hours: 6.25   Current Medications: Current Facility-Administered Medications  Medication Dose Route Frequency Provider Last Rate Last Dose  . acetaminophen (TYLENOL) tablet 650 mg  650 mg Oral Q6H PRN Nehemiah SettleJanardhaha R Jonnalagadda, MD   650 mg at 04/04/14 1611  . alum & mag hydroxide-simeth (MAALOX/MYLANTA) 200-200-20 MG/5ML suspension 30 mL  30 mL Oral Q4H PRN Nehemiah SettleJanardhaha R Jonnalagadda, MD      . amphetamine-dextroamphetamine (ADDERALL XR) 24 hr capsule 15 mg  15 mg Oral Daily Nehemiah MassedFernando Cobos, MD   15 mg at 04/04/14 0856  . clonazePAM (KLONOPIN) tablet 0.5 mg  0.5 mg Oral Daily Nehemiah SettleJanardhaha R Jonnalagadda, MD   0.5 mg at 04/03/14 0904  . docusate sodium (COLACE) capsule 100 mg  100 mg Oral BID Nehemiah SettleJanardhaha R Jonnalagadda, MD   100 mg at 04/04/14 1649  . hydrocortisone cream 1 %   Topical QID Kristeen MansFran E Hobson, NP      . hydrOXYzine (ATARAX/VISTARIL) tablet 25 mg  25 mg Oral Q6H PRN Kristeen MansFran E Hobson, NP   25 mg at 04/03/14 2301  . lamoTRIgine (LAMICTAL) tablet 100 mg  100 mg Oral Daily Nehemiah SettleJanardhaha R Jonnalagadda, MD   100 mg at  04/04/14 0820  . Linaclotide (LINZESS) capsule 145 mcg  145 mcg Oral Daily Nehemiah SettleJanardhaha R Jonnalagadda, MD   145 mcg at 04/04/14 0819  . magnesium hydroxide (MILK OF MAGNESIA) suspension 30 mL  30 mL Oral Daily PRN Nehemiah SettleJanardhaha R Jonnalagadda, MD      . nicotine (NICODERM CQ - dosed in mg/24 hours) patch 21 mg  21 mg Transdermal Daily Beau FannyJohn C Withrow, FNP   21 mg at 04/04/14 0857  . nicotine polacrilex (NICORETTE) gum 2 mg  2 mg Oral PRN Beau FannyJohn C Withrow, FNP   2 mg at 04/03/14 0909  . Oxcarbazepine (TRILEPTAL) tablet  900 mg  900 mg Oral QHS Nehemiah SettleJanardhaha R Jonnalagadda, MD   900 mg at 04/03/14 2107  . phenazopyridine (PYRIDIUM) tablet 200 mg  200 mg Oral TID WC Nehemiah SettleJanardhaha R Jonnalagadda, MD   200 mg at 04/04/14 1649  . polyethylene glycol (MIRALAX / GLYCOLAX) packet 17 g  17 g Oral BID Nehemiah SettleJanardhaha R Jonnalagadda, MD   17 g at 04/03/14 1705    Lab Results:  No results found for this or any previous visit (from the past 48 hour(s)).  Physical Findings: AIMS:  , ,  ,  ,    CIWA:    COWS:     Assessment-  Patient improving, reports improved mood and affect. Less depressed. No SI .  Still feeling anxious, and reports she feels " panicky". We reviewed this as a potential side effect of Adderall, but she denies , stating she has been on this medication previously with no anxiety, and actually feeling calmer on this medication as it treated ADD symptoms.   Treatment Plan Summary: Daily contact with patient to assess and evaluate symptoms and progress in treatment Medication management  Plan: Increase Klonopin to 0.5 mgrs BID.  Continue other medications at current dose. Continue inpatient care, milieu, group therapy.     Medical Decision Making Problem Points:  Established problem, stable/improving (1) Data Points:  Review of medication regiment & side effects (2) Review of new medications or change in dosage (2)  I certify that inpatient services furnished can reasonably be expected to improve the patient's condition.   Nehemiah MassedCOBOS, FERNANDO, FNP-BC 04/04/2014, 5:20 PM

## 2014-04-05 MED ORDER — CLONAZEPAM 0.5 MG PO TABS
0.5000 mg | ORAL_TABLET | Freq: Three times a day (TID) | ORAL | Status: DC | PRN
  Administered 2014-04-05: 0.5 mg via ORAL
  Filled 2014-04-05 (×2): qty 1

## 2014-04-05 MED ORDER — FLEET ENEMA 7-19 GM/118ML RE ENEM
1.0000 | ENEMA | Freq: Once | RECTAL | Status: DC
  Filled 2014-04-05 (×2): qty 1

## 2014-04-05 NOTE — Progress Notes (Signed)
D: Patient denies SI/HI and A/V hallucinations; patient reports sleep is well; reports appetite is good; reports energy level is high; reports ability to pay attention is good; rates depression as 3/10; rates hopelessness 3/10; patient reports constipation and that she wants a fleet enema, reports that her adderall should be BID instead of daily; physician has been made aware of this  A: Monitored q 15 minutes; patient encouraged to attend groups; patient educated about medications; patient given medications per physician orders; patient encouraged to express feelings and/or concerns  R: Patient is animated and very assertive; patient is appropriate to circumstances;  patient's interaction with staff and peers is appropriate; patient was able to set goal to talk with staff 1:1 when having feelings of SI; patient is taking medications as prescribed and tolerating medications

## 2014-04-05 NOTE — Progress Notes (Signed)
Patient ID: Denzil MagnusonKrista Wickham, female   DOB: 11/22/1980, 33 y.o.   MRN: [id number] Pt visible in the milieu.  Interacting appropriately with staff and peers.  Pt did not attend evening group.  Pt reporting feelings of anxiety (see MAR).  Pt denied SI, HI and AVH.  Fifteen minute checks in progress for patient safety.  Pt safe on unit.

## 2014-04-05 NOTE — Progress Notes (Addendum)
Patient ID: Heather Preston, female   DOB: 21-Dec-1980, 33 y.o.   MRN: [id number] [facility] MD Progress Note  04/05/2014 3:29 PM Heather Preston  MRN:  [id number] Subjective:  Patient reports she is feeling better.Complains of constipation.  Objective: I have discussed case with treatment team and have also met with patient. She reports improvement. She does continue to report some depression, anxiety, but better than before. No full blown panic attacks, and anxiety, although still present has abated.  She states that the stimulant medication ( currently on Adderall XR) has been very helpful for her, and " is calming me down, because it helps my ADD". She denies side effects, although as noted , complains of constipation. Currently on Trileptal, low dose Klonopin, Lamictal, Adderall .  Going to groups, no behavioral issues on unit.  Sleeping better, and denies significant nightmares or hypervigilance. 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 Upon discharge plans to follow up with psychiatrist at [facility], with whom she has a good therapeutic alliance. ( Dr. Merrily Pew, in [place])     Diagnosis:  MDD was initial/admit diagnosis. Patient also reports history of PTSD and of ADHD.       ADL's:  Intact  Sleep: good   Appetite: good   Suicidal Ideation:  Patient had suicide attempt with multiple medication overdose but contracts for safety while in hospital  Homicidal Ideation:  Denied  AEB (as evidenced by):  Psychiatric Specialty Exam: Review of Systems  Constitutional: Negative for fever and chills.  Respiratory: Negative for shortness of breath.   Cardiovascular: Negative for chest pain.  Genitourinary: Negative for dysuria and urgency.  Psychiatric/Behavioral: Positive for depression. Negative for suicidal ideas. The patient is nervous/anxious.     Blood  pressure 122/93, pulse 100, temperature 98.1 F (36.7 C), temperature source Oral, resp. rate 18, height _0  (1.6 m), weight 51.256 kg (113 lb), SpO2 97.00%.Body mass index is 20.02 kg/(m^2).  General Appearance: well groomed   Engineer, water::  Fair  Speech:  Clear and Coherent  Volume:  Decreased  Mood:  Mood is improving, still somewhat anxious, but better than upon admission  Affect:  Appropriate, pleasant   Thought Process:  Goal Directed and Intact  Orientation:  Full (Time, Place, and Person)  Thought Content:  No psychotic symptoms   Suicidal Thoughts:  Denies any SI   Homicidal Thoughts:  No  Memory:  Immediate;   Fair  Judgement:  Impaired  Insight:  Lacking  Psychomotor Activity:  Normal   Concentration:  Fair  Recall:  Fair  Akathisia:  NA  Handed:  Right  AIMS (if indicated):     Assets:  Communication Skills Desire for Improvement Financial Resources/Insurance Housing Physical Health Resilience Social Support Transportation  Sleep:  Number of Hours: 6.25   Current Medications: Current Facility-Administered Medications  Medication Dose Route Frequency Provider Last Rate Last Dose  . acetaminophen (TYLENOL) tablet 650 mg  650 mg Oral Q6H PRN Durward Parcel, MD   650 mg at 04/05/14 0823  . alum & mag hydroxide-simeth (MAALOX/MYLANTA) 200-200-20 MG/5ML suspension 30 mL  30 mL Oral Q4H PRN Durward Parcel, MD      . amphetamine-dextroamphetamine (ADDERALL XR) 24 hr capsule 15 mg  15 mg Oral Daily Neita Garnet, MD   15 mg at 04/05/14 0816  . clonazePAM (KLONOPIN) tablet  0.5 mg  0.5 mg Oral BID Neita Garnet, MD   0.5 mg at 04/05/14 0817  . docusate sodium (COLACE) capsule 100 mg  100 mg Oral BID Durward Parcel, MD   100 mg at 04/05/14 0817  . hydrocortisone cream 1 %   Topical QID Lurena Nida, NP      . hydrOXYzine (ATARAX/VISTARIL) tablet 25 mg  25 mg Oral Q6H PRN Lurena Nida, NP   25 mg at 04/04/14 2105  . lamoTRIgine  (LAMICTAL) tablet 100 mg  100 mg Oral Daily Durward Parcel, MD   100 mg at 04/05/14 0817  . Linaclotide (LINZESS) capsule 145 mcg  145 mcg Oral Daily Durward Parcel, MD   145 mcg at 04/05/14 0816  . magnesium hydroxide (MILK OF MAGNESIA) suspension 30 mL  30 mL Oral Daily PRN Durward Parcel, MD      . nicotine (NICODERM CQ - dosed in mg/24 hours) patch 21 mg  21 mg Transdermal Daily Benjamine Mola, FNP   21 mg at 04/05/14 4709  . nicotine polacrilex (NICORETTE) gum 2 mg  2 mg Oral PRN Benjamine Mola, FNP   2 mg at 04/03/14 0909  . Oxcarbazepine (TRILEPTAL) tablet 900 mg  900 mg Oral QHS Durward Parcel, MD   900 mg at 04/04/14 2104  . phenazopyridine (PYRIDIUM) tablet 200 mg  200 mg Oral TID WC Durward Parcel, MD   200 mg at 04/05/14 1145  . polyethylene glycol (MIRALAX / GLYCOLAX) packet 17 g  17 g Oral BID Durward Parcel, MD   17 g at 04/05/14 6283    Lab Results:  No results found for this or any previous visit (from the past 48 hour(s)).  Physical Findings: AIMS:  , ,  ,  ,    CIWA:    COWS:     Assessment-  Patient  Continues to improve gradually, with less severe anxiety, and less emotional lability. More future oriented at this time. She is tolerating medications well. She requests PRN Klonopin  doses in case she feels panicky, rather than standing  Tolerating medications well.    Treatment Plan Summary: Daily contact with patient to assess and evaluate symptoms and progress in treatment Medication management Add Klonopin 0.5 mgrs  TID PRN ANXIETY. As requested by patient Fleets Enema x 1 to address constipation, which she attributes to medications and hospital food/environment.   Plan: As above  Continue other medications at current dose. Continue inpatient care, milieu, group therapy.     Medical Decision Making Problem Points:  Established problem, stable/improving (1) Data Points:  Review of new  medications or change in dosage (2)  I certify that inpatient services furnished can reasonably be expected to improve the patient's condition.   Neita Garnet, FNP-BC 04/05/2014, 3:29 PM

## 2014-04-05 NOTE — Tx Team (Signed)
Interdisciplinary Treatment Plan Update (Adult)  Date: 04/05/2014  Time Reviewed:  9:45 AM  Progress in Treatment: Attending groups: Yes Participating in groups:  Yes Taking medication as prescribed:  Yes Tolerating medication:  Yes Family/Significant othe contact made: Yes, with pt's father Patient understands diagnosis:  Yes Discussing patient identified problems/goals with staff:  Yes Medical problems stabilized or resolved:  Yes Denies suicidal/homicidal ideation: Yes Issues/concerns per patient self-inventory:  Yes Other:  New problem(s) identified: N/A  Discharge Plan or Barriers: Pt [facility] for medication management and therapy.    Reason for Continuation of Hospitalization: Anxiety Depression Medication Stabilization  Comments: N/A  Estimated length of stay: 1 day, d/c tomorrow  For review of initial/current patient goals, please see plan of care.  Attendees: Patient:     Family:     Physician:  Dr. Jama Flavorsobos 04/05/2014 9:25 AM   Nursing:   Leighton ParodyBritney Tyson, RN 04/05/2014 9:25 AM   Clinical Social Worker:  Reyes Ivanhelsea Horton, LCSW 04/05/2014 9:25 AM   Other: Neill Loftarol Davis, RN 04/05/2014 9:25 AM   Other:  Quintella ReichertBeverly Knight, RN 04/05/2014 9:25 AM   Other:  Juline PatchQuylle Hodnett, LCSW 04/05/2014 9:25 AM   Other:  Piedad ClimesKelley Miller, Pharmacy resident 04/05/2014 9:29 AM   Other:    Other:    Other:    Other:    Other:     Scribe for Treatment Team:   Carmina MillerHorton, Chelsea Nicole, 04/05/2014 9:29 AM

## 2014-04-05 NOTE — [facility] Group Notes (Signed)
[facility] LCSW Group Therapy  04/05/2014   1:15 PM   Type of Therapy:  Group Therapy  Participation Level:  Active  Participation Quality:  Attentive, Sharing and Supportive  Affect:  Depressed and Flat  Cognitive:  Alert and Oriented  Insight:  Developing/Improving and Engaged  Engagement in Therapy:  Developing/Improving and Engaged  Modes of Intervention:  Activity, Clarification, Confrontation, Discussion, Education, Exploration, Limit-setting, Orientation, Problem-solving, Rapport Building, Reality Testing, Socialization and Support  Summary of Progress/Problems: Patient was attentive and engaged with speaker from [employer].  Patient was attentive to speaker while they shared their story of dealing with mental health and overcoming it.  Patient expressed interest in their programs and services and received information on their agency.  Patient processed ways they can relate to the speaker.     Ramie Erman Horton, LCSW 04/05/2014  2:21 PM   

## 2014-04-05 NOTE — Progress Notes (Signed)
Pt presents with orthostatic hypotension and tachycardia. Pt's BP was 83/64 standing with a pulse of 128. Writer talked with pt at breakfast to assess for any additional symptoms. Pt complains of anxiety only at this time. No additional physical symptoms. Pt was encouraged to increase fluid intakes. On- call extender contacted with the following instructions:  Recheck pt's BP and HR manually in sitting, lying, and standing position. Contact current provider for any drop greater than 10 in this pt's systolic or diastolic pressure. Force fluids.

## 2014-04-05 NOTE — Progress Notes (Signed)
Adult Psychoeducational Group Note  Date:  04/05/2014 Time:  10:14 PM  Group Topic/Focus:  Wrap-Up Group:   The focus of this group is to help patients review their daily goal of treatment and discuss progress on daily workbooks.  Participation Level:  Did Not Attend  Participation Quality:  None   Affect:  Flat  Cognitive:  Confused  Insight: None  Engagement in Group:  none  Modes of Intervention:  none  Additional Comments:  Pt did not attend group... Pt was sleeping   Pettress, Delton R 04/05/2014, 10:14 PM

## 2014-04-06 DIAGNOSIS — F332 Major depressive disorder, recurrent severe without psychotic features: Principal | ICD-10-CM

## 2014-04-06 DIAGNOSIS — F411 Generalized anxiety disorder: Secondary | ICD-10-CM

## 2014-04-06 MED ORDER — PHENAZOPYRIDINE HCL 200 MG PO TABS: 200.0000 mg | ORAL_TABLET | Freq: Three times a day (TID) | ORAL | Status: DC

## 2014-04-06 MED ORDER — CEFUROXIME AXETIL 500 MG PO TABS: 500.0000 mg | ORAL_TABLET | Freq: Two times a day (BID) | ORAL | Status: DC

## 2014-04-06 MED ORDER — LAMOTRIGINE 100 MG PO TABS: 100.0000 mg | ORAL_TABLET | Freq: Every day | ORAL | Status: DC

## 2014-04-06 MED ORDER — IBUPROFEN 800 MG PO TABS
ORAL_TABLET | ORAL | Status: AC
  Filled 2014-04-06: qty 1

## 2014-04-06 MED ORDER — LINACLOTIDE 145 MCG PO CAPS: 145.0000 ug | ORAL_CAPSULE | Freq: Every day | ORAL | Status: DC

## 2014-04-06 MED ORDER — DSS 100 MG PO CAPS: 100.0000 mg | ORAL_CAPSULE | Freq: Two times a day (BID) | ORAL | Status: DC

## 2014-04-06 MED ORDER — AMPHETAMINE-DEXTROAMPHET ER 15 MG PO CP24: 15.0000 mg | ORAL_CAPSULE | Freq: Every day | ORAL | Status: DC

## 2014-04-06 MED ORDER — OXCARBAZEPINE 300 MG PO TABS: 900.0000 mg | ORAL_TABLET | Freq: Every day | ORAL | Status: DC

## 2014-04-06 MED ORDER — IBUPROFEN 800 MG PO TABS
800.0000 mg | ORAL_TABLET | Freq: Once | ORAL | Status: AC
  Administered 2014-04-06: 800 mg via ORAL
  Filled 2014-04-06: qty 1

## 2014-04-06 MED ORDER — CLONAZEPAM 0.5 MG PO TABS: 0.5000 mg | ORAL_TABLET | Freq: Three times a day (TID) | ORAL | Status: DC | PRN

## 2014-04-06 NOTE — Progress Notes (Signed)
Continued support with pt while awaiting discharge.

## 2014-04-06 NOTE — [facility] Group Notes (Signed)
[facility]   04/06/2014  8:45 AM  Participation Quality:  Did Not Attend - pt sleeping in her room  Reyes IvanChelsea Horton, LCSW 04/06/2014 9:44 AM

## 2014-04-06 NOTE — [facility] Suicide Risk Assessment (Signed)
Demographic Factors:  33 year old  Single female, currently unemployed   Total Time spent with patient: 30 minutes  Psychiatric Specialty Exam: Physical Exam  ROS  Blood pressure 103/75, pulse 101, temperature 97.7 F (36.5 C), temperature source Oral, resp. rate 18, height 5\' 3"  (1.6 m), weight 51.256 kg (113 lb), SpO2 97.00%.Body mass index is 20.02 kg/(m^2).  General Appearance: Well Groomed  Patent attorneyye Contact::  Good  Speech:  Normal Rate  Volume:  Normal  Mood:  reports as improved, denies depression currently, appears vaguely irritable  Affect:  Appropriate and slightly irritable  Thought Process:  Linear  Orientation:  Full (Time, Place, and Person)  Thought Content:  denies any hallucinations and does not appear internally preoccupied- no delusions expressed. Tends to focus on medication issues   Suicidal Thoughts:  No- denies any thoughts of hurting herself  Homicidal Thoughts:  No- denies any thoughts of hurting anyone else   Memory:  NA  Judgement:  Improved   Insight:  Fair  Psychomotor Activity:  Normal  Concentration:  Fair  Recall:  Good  Fund of Knowledge:Good  Language: Good  Akathisia:  No  Handed:  Right  AIMS (if indicated):     Assets:  Communication Skills Desire for Improvement Housing Social Support Talents/Skills  Sleep:  Number of Hours: 6.75    Musculoskeletal: Strength & Muscle Tone: within normal limits Gait & Station: normal Patient leans: N/A   Mental Status Per Nursing Assessment::   On Admission:     Current Mental Status by Physician: At the present time patient is not suicidal , not homicidal , not psychotic, and future oriented, looking forward to applying for jobs and returning to being employed in the near future .  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  There are no identifiable plans, no associated intent, mild dysphoria and related symptoms, good self-control (both objective and subjective assessment), few other risk factors, and identifiable protective factors, including available and accessible social support.  Discharge Diagnoses:   AXIS I:  ADHD, combined type and Depressive Disorder NOS AXIS II:  Deferred AXIS III:   Past Medical History  Diagnosis Date  . Kidney infection   . UTI (lower urinary tract infection)   . ADD (attention deficit disorder)   . Anxiety   . Depression   . Bipolar disorder    AXIS IV:  occupational problems AXIS V:  51-60 moderate symptoms  Plan Of Care/Follow-up recommendations:  Activity:  as tolerated  Diet:  regular Tests:  NA Other:  Patient following up as follows  Has appointment on 6/18 with Dr. Darlyn ReadValerie Vestal, [facility], with whom she reports good therapeutic alliance  Follow up with Ms. Su LeyJayne Lessard for individual psychotherapy on 6/19. We discussed medication  Side effects, to include potential teratogenic effects of her current medications. Also reviewed patient's mild anemia, and she stated she would follow up with her PCP for this issue. Of note, patient requested for Adderall dose to be increased prior to discharge -  will continue current dose, but patient will discuss potential dose adjustments with her psychiatrist when she sees her tomorrow.   Patient plans to live with her parents for the time being, and states she has a good relationship with them and they are very supportive. She also has her own apartment and may move  there in a few days .  Is patient on multiple antipsychotic therapies at discharge:  No   Has Patient had three or more failed trials of antipsychotic monotherapy by history:  No  Recommended Plan for Multiple Antipsychotic Therapies: NA    Rockie Schnoor 04/06/2014, 2:15 PM

## 2014-04-06 NOTE — Progress Notes (Signed)
Adult Activity Group Note  Date: 04/06/14 Time:10am  Group Topic: Art  Participation Level: Did Not Attend   Activity: Patients asked to create art to coincide with daily unit theme using any combination of markers, crayons, color pencils, construction paper, magazine clippings, glue, and scissors.   Additional Comments: Pts explored summertime and the activities they would like to explore when they leave the hospital. Pts created a list on poster board.

## 2014-04-06 NOTE — Progress Notes (Signed)
Patient discharged per physician order; patient denies SI/HI and A/V hallucinations; patient received prescriptions and copy of AVS after it was reviewed; patient had no other questions or concerns at this time; patient verbalized and signed that he received all belongings; patient left the unit ambulatory

## 2014-04-06 NOTE — Progress Notes (Signed)
[facility] Discharge Plan :  Will you be returning to the same living situation after discharge: Yes,  returning home with family support At discharge, do you have transportation home?:Yes,  family will pick pt up at d/c Do you have the ability to pay for your medications:Yes,  provided pt with prescriptions and pt verbalizes ability to afford meds  Release of information consent forms completed and in the chart;  Patient's signature needed at discharge.  Patient to Follow up at: Follow-up Information   Follow up with [facility] On 04/07/2014. (Appointment scheduled at 2:30 pm on this date with Darlyn ReadValerie Vestal for medication management)    Contact information:   [address] [place], [place] [address] Phone: [phone] Fax: [phone]      Follow up with Su LeyJayne Lessard, LPC On 04/08/2014. (Appointment scheduled at 12:00 pm on this date for therapy)    Contact information:   [address] [place], [place] [address] Phone: [phone] Fax: [phone]      Patient denies SI/HI:   Yes,  denies SI/HI    Safety Planning and Suicide Prevention discussed:  Yes,  discussed with pt and pt's father.  See suicide prevention education note.   Carmina MillerHorton, Chelsea Nicole 04/06/2014, 10:29 AM

## 2014-04-11 NOTE — Progress Notes (Addendum)
Patient Discharge Instructions:  After Visit Summary (AVS):   Faxed to:  04/11/14 Psychiatric Admission Assessment Note:   Faxed to:  04/11/14 Suicide Risk Assessment - Discharge Assessment:   Faxed to:  04/11/14 Faxed/Sent to the Next Level Care provider:  04/11/14 Faxed to [facility] @ [phone] No documentation was sent to Christain SacramentoJane Lessard, [facility] for [facility].  Unable to confirm fax number or address. Multiple messages no response.   Jerelene ReddenSheena E Grace, 04/11/2014, 4:13 PM
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
## 2014-08-30 ENCOUNTER — Emergency Department (HOSPITAL_BASED_OUTPATIENT_CLINIC_OR_DEPARTMENT_OTHER)
Admission: EM | Admit: 2014-08-30 | Discharge: 2014-08-30 | Disposition: A | Discharge status: Home / Self Care | Payer: BC Managed Care – PPO | Attending: Emergency Medicine | Admitting: Emergency Medicine

## 2014-08-30 ENCOUNTER — Emergency Department (HOSPITAL_BASED_OUTPATIENT_CLINIC_OR_DEPARTMENT_OTHER): Payer: BC Managed Care – PPO

## 2014-08-30 ENCOUNTER — Encounter (HOSPITAL_BASED_OUTPATIENT_CLINIC_OR_DEPARTMENT_OTHER): Payer: Self-pay | Admitting: *Deleted

## 2014-08-30 DIAGNOSIS — X58XXXA Exposure to other specified factors, initial encounter: Secondary | ICD-10-CM | POA: Diagnosis not present

## 2014-08-30 DIAGNOSIS — Y929 Unspecified place or not applicable: Secondary | ICD-10-CM | POA: Diagnosis not present

## 2014-08-30 DIAGNOSIS — S29012A Strain of muscle and tendon of back wall of thorax, initial encounter: Secondary | ICD-10-CM | POA: Insufficient documentation

## 2014-08-30 DIAGNOSIS — R509 Fever, unspecified: Secondary | ICD-10-CM | POA: Insufficient documentation

## 2014-08-30 DIAGNOSIS — M25512 Pain in left shoulder: Secondary | ICD-10-CM | POA: Diagnosis present

## 2014-08-30 DIAGNOSIS — Z79899 Other long term (current) drug therapy: Secondary | ICD-10-CM | POA: Diagnosis not present

## 2014-08-30 DIAGNOSIS — Z87440 Personal history of urinary (tract) infections: Secondary | ICD-10-CM | POA: Diagnosis not present

## 2014-08-30 DIAGNOSIS — R52 Pain, unspecified: Secondary | ICD-10-CM | POA: Insufficient documentation

## 2014-08-30 DIAGNOSIS — Y998 Other external cause status: Secondary | ICD-10-CM | POA: Insufficient documentation

## 2014-08-30 DIAGNOSIS — F319 Bipolar disorder, unspecified: Secondary | ICD-10-CM | POA: Insufficient documentation

## 2014-08-30 DIAGNOSIS — F419 Anxiety disorder, unspecified: Secondary | ICD-10-CM | POA: Diagnosis not present

## 2014-08-30 DIAGNOSIS — Y939 Activity, unspecified: Secondary | ICD-10-CM | POA: Diagnosis not present

## 2014-08-30 DIAGNOSIS — S46812A Strain of other muscles, fascia and tendons at shoulder and upper arm level, left arm, initial encounter: Secondary | ICD-10-CM

## 2014-08-30 DIAGNOSIS — Z720 Tobacco use: Secondary | ICD-10-CM | POA: Insufficient documentation

## 2014-08-30 MED ORDER — NAPROXEN 375 MG PO TABS: 375.0000 mg | ORAL_TABLET | Freq: Two times a day (BID) | ORAL | Status: DC | PRN

## 2014-08-30 MED ORDER — MORPHINE SULFATE 4 MG/ML IJ SOLN
6.0000 mg | Freq: Once | INTRAMUSCULAR | Status: AC
  Administered 2014-08-30: 6 mg via INTRAMUSCULAR
  Filled 2014-08-30: qty 2

## 2014-08-30 MED ORDER — ORPHENADRINE CITRATE ER 100 MG PO TB12: 100.0000 mg | ORAL_TABLET | Freq: Two times a day (BID) | ORAL | Status: DC | PRN

## 2014-08-30 MED ORDER — KETOROLAC TROMETHAMINE 60 MG/2ML IM SOLN
60.0000 mg | Freq: Once | INTRAMUSCULAR | Status: AC
  Administered 2014-08-30: 60 mg via INTRAMUSCULAR
  Filled 2014-08-30: qty 2

## 2014-08-30 MED ORDER — CYCLOBENZAPRINE HCL 10 MG PO TABS
5.0000 mg | ORAL_TABLET | Freq: Once | ORAL | Status: AC
  Administered 2014-08-30: 5 mg via ORAL
  Filled 2014-08-30: qty 1

## 2014-08-30 NOTE — ED Provider Notes (Signed)
CSN: [id number]     Arrival date & time 08/30/14  1857 History  This chart was scribed for Enid SkeensJoshua M Khamille Beynon, MD by Evon Slackerrance Branch, ED Scribe. This patient was seen in room MH05/MH05 and the patient's care was started at 7:32 PM.      Chief Complaint  Patient presents with  . Shoulder Pain   Patient is a 33 y.o. female presenting with shoulder pain. The history is provided by the patient. No language interpreter was used.  Shoulder Pain Associated symptoms: fever (subjective)    HPI Comments: Heather Preston is a 33 y.o. female who presents to the Emergency Department complaining of intermittientleft shoulder pain onset 3 years ago. That recently worsened over the past week. She states she has a Hx of fibromyalgia that she doesn't not take pain medication for. She states she has subjective fever. She states she has been taking ibuprofen with no relief. She states that she has also tried massages with no relief. She denies injury or trauma. Denies any neck surgeries.  She denies nausea, vomiting or diarrhea.      Past Medical History  Diagnosis Date  . Kidney infection   . UTI (lower urinary tract infection)   . ADD (attention deficit disorder)   . Anxiety   . Depression   . Bipolar disorder    Past Surgical History  Procedure Laterality Date  . Urethra surgery     No family history on file. History  Substance Use Topics  . Smoking status: Current Every Day Smoker -- 1.00 packs/day    Types: Cigarettes  . Smokeless tobacco: Not on file  . Alcohol Use: Yes   OB History    No data available     Review of Systems  Constitutional: Positive for fever (subjective).  Gastrointestinal: Negative for nausea, vomiting and diarrhea.  Musculoskeletal: Positive for myalgias (left shoulder) and arthralgias.    Allergies  Review of patient's allergies indicates no known allergies.  Home Medications   Prior to Admission medications   Medication Sig Start Date End Date Taking?  Authorizing Provider  amphetamine-dextroamphetamine (ADDERALL XR) 15 MG 24 hr capsule Take 1 capsule (15 mg total) by mouth daily. 04/06/14   Beau FannyJohn C Withrow, FNP  cefUROXime (CEFTIN) 500 MG tablet Take 1 tablet (500 mg total) by mouth 2 (two) times daily with a meal. stop date 04-03-14 04/06/14   Beau FannyJohn C Withrow, FNP  clonazePAM (KLONOPIN) 0.5 MG tablet Take 1 tablet (0.5 mg total) by mouth 3 (three) times daily as needed (Anxiety). 04/06/14   Beau FannyJohn C Withrow, FNP  Docusate Sodium (DSS) 100 MG CAPS Take 100 mg by mouth 2 (two) times daily. 04/06/14   Beau FannyJohn C Withrow, FNP  lamoTRIgine (LAMICTAL) 100 MG tablet Take 1 tablet (100 mg total) by mouth daily. 04/06/14   Beau FannyJohn C Withrow, FNP  Linaclotide (LINZESS) 145 MCG CAPS capsule Take 1 capsule (145 mcg total) by mouth daily. 04/06/14   Beau FannyJohn C Withrow, FNP  Oxcarbazepine (TRILEPTAL) 300 MG tablet Take 3 tablets (900 mg total) by mouth at bedtime. 04/06/14   Beau FannyJohn C Withrow, FNP  phenazopyridine (PYRIDIUM) 200 MG tablet Take 1 tablet (200 mg total) by mouth 3 (three) times daily with meals. 04/06/14   Beau FannyJohn C Withrow, FNP   Triage Vitals: BP 133/90 mmHg  Pulse 114  Temp(Src) 98 F (36.7 C) (Oral)  Resp 20  Ht 5\' 3"  (1.6 m)  Wt 113 lb (51.256 kg)  BMI 20.02 kg/m2  SpO2 100%  LMP  08/09/2014  Physical Exam  Constitutional: She is oriented to person, place, and time. She appears well-developed and well-nourished. No distress.  HENT:  Head: Normocephalic and atraumatic.  Eyes: Conjunctivae and EOM are normal.  Neck: Neck supple.  Cardiovascular: Normal rate.   Pulmonary/Chest: Effort normal. No respiratory distress.  Musculoskeletal: Normal range of motion.  Localizes pain to left trapezius and left scapula extends paraspinal to the occiput mostly into the trapezius.  Neurological: She is alert and oriented to person, place, and time. She has normal strength. No sensory deficit. GCS eye subscore is 4. GCS verbal subscore is 5. GCS motor subscore is 6.   Reflex Scores:      Bicep reflexes are 1+ on the right side and 1+ on the left side. 5+ plus strength in both upper and lower extremitiesin major muscle groups bilateral   Skin: Skin is warm and dry.  Psychiatric: She has a normal mood and affect. Her behavior is normal.  Nursing note and vitals reviewed.   ED Course  Procedures (including critical care time) DIAGNOSTIC STUDIES: Oxygen Saturation is 100% on RA, normal by my interpretation.    COORDINATION OF CARE: 7:54 PM-Discussed treatment plan which includes pain medications and muscle relaxants with pt at bedside and pt agreed to plan.     Labs Review Labs Reviewed - No data to display  Imaging Review Dg Shoulder Left  08/30/2014   CLINICAL DATA:  Left shoulder pain for 2 weeks  EXAM: LEFT SHOULDER - 2+ VIEW  COMPARISON:  None.  FINDINGS: Three views of left shoulder submitted. No acute fracture or subluxation. No radiopaque foreign body. Glenohumeral joint is preserved.  IMPRESSION: Negative.   Electronically Signed   By: Natasha MeadLiviu  Pop M.D.   On: 08/30/2014 19:27     EKG Interpretation None      MDM   Final diagnoses:  Pain     I personally performed the services described in this documentation, which was scribed in my presence. The recorded information has been reviewed and is accurate.  Patient with clinically musculoskeletal pain worse in the trapezius. Patient says is similar to her fibromyalgia pain. Patient normal neuro exam the ER, mild tachycardia likely from pain and anxiety. Discussed outpatient follow-up for recheck and possible MRI if she develops weakness, numbness or worsening pain in the left shoulder and arm. Results and differential diagnosis were discussed with the patient/parent/guardian. Close follow up outpatient was discussed, comfortable with the plan.   Medications  morphine 4 MG/ML injection 6 mg (6 mg Intramuscular Given 08/30/14 2011)  ketorolac (TORADOL) injection 60 mg (60 mg  Intramuscular Given 08/30/14 2012)  cyclobenzaprine (FLEXERIL) tablet 5 mg (5 mg Oral Given 08/30/14 2011)    Filed Vitals:   08/30/14 1902 08/30/14 2029  BP: 133/90 128/87  Pulse: 114 105  Temp: 98 F (36.7 C)   TempSrc: Oral   Resp: 20 18  Height: 5\' 3"  (1.6 m)   Weight: 113 lb (51.256 kg)   SpO2: 100% 100%    Final diagnoses:  Trapezius strain, left, initial encounter  Total body pain      Enid SkeensJoshua M Derel Mcglasson, MD 08/31/14 0020

## 2014-08-30 NOTE — ED Notes (Signed)
Pain in her left shoulder for 2 weeks.

## 2014-08-30 NOTE — ED Notes (Signed)
Pt discharged to home with family. NAD.  

## 2014-08-30 NOTE — Discharge Instructions (Signed)
If you were given medicines take as directed.  If you are on coumadin or contraceptives realize their levels and effectiveness is altered by many different medicines.  If you have any reaction (rash, tongues swelling, other) to the medicines stop taking and see a physician.   Please follow up as directed and return to the ER or see a physician for new or worsening symptoms.  Thank you. Filed Vitals:   08/30/14 1902  BP: 133/90  Pulse: 114  Temp: 98 F (36.7 C)  TempSrc: Oral  Resp: 20  Height: 5\' 3"  (1.6 m)  Weight: 113 lb (51.256 kg)  SpO2: 100%

## 2014-10-05 ENCOUNTER — Encounter: Payer: Self-pay | Admitting: Neurology

## 2014-10-05 ENCOUNTER — Ambulatory Visit (INDEPENDENT_AMBULATORY_CARE_PROVIDER_SITE_OTHER): Payer: BC Managed Care – PPO | Admitting: Neurology

## 2014-10-05 VITALS — BP 125/86 | HR 110 | Ht 62.5 in | Wt 119.8 lb

## 2014-10-05 DIAGNOSIS — M542 Cervicalgia: Secondary | ICD-10-CM

## 2014-10-05 DIAGNOSIS — R208 Other disturbances of skin sensation: Secondary | ICD-10-CM

## 2014-10-05 DIAGNOSIS — G1221 Amyotrophic lateral sclerosis: Secondary | ICD-10-CM

## 2014-10-05 DIAGNOSIS — M5010 Cervical disc disorder with radiculopathy, unspecified cervical region: Secondary | ICD-10-CM

## 2014-10-05 DIAGNOSIS — R200 Anesthesia of skin: Secondary | ICD-10-CM

## 2014-10-05 DIAGNOSIS — R29898 Other symptoms and signs involving the musculoskeletal system: Secondary | ICD-10-CM | POA: Insufficient documentation

## 2014-10-05 DIAGNOSIS — R531 Weakness: Secondary | ICD-10-CM

## 2014-10-05 DIAGNOSIS — G1229 Other motor neuron disease: Secondary | ICD-10-CM

## 2014-10-05 MED ORDER — GABAPENTIN 300 MG PO CAPS: 300.0000 mg | ORAL_CAPSULE | Freq: Three times a day (TID) | ORAL | Status: DC | PRN

## 2014-10-05 MED ORDER — METHYLPREDNISOLONE 4 MG PO KIT: PACK | ORAL | Status: DC
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
## 2014-10-05 NOTE — Patient Instructions (Addendum)
Overall you are doing fairly well but I do want to suggest a few things today:   Remember to drink plenty of fluid, eat healthy meals and do not skip any meals. Try to eat protein with a every meal and eat a healthy snack such as fruit or nuts in between meals. Try to keep a regular sleep-wake schedule and try to exercise daily, particularly in the form of walking, 20-30 minutes a day, if you can.   As far as your medications are concerned, I would like to suggest: Epidural steroid injections, Medrol dose pak, Gabapentin 300mg  up to 3x a day as needed for nerve pain  As far as diagnostic testing: MRI thoracic and lumbar spine, physical therapy, acupuncture  I would like to see you back in 3 months, sooner if we need to. Please call us with any interim questions, concerns, problems, updates or refill requests.   Please also call us for any test results so we can go over those with you on the phone.  My clinical assistant and will answer any of your questions and relay your messages to me and also relay most of my messages to you.   Our phone number is [phone]. We also have an after hours call service for urgent matters and there is a physician on-call for urgent questions. For any emergencies you know to call 911 or go to the nearest emergency room

## 2015-03-30 ENCOUNTER — Other Ambulatory Visit: Payer: Self-pay | Admitting: Neurology

## 2015-05-01 ENCOUNTER — Encounter: Payer: Self-pay | Admitting: Neurology

## 2015-05-02 ENCOUNTER — Encounter: Payer: Self-pay | Admitting: Neurology

## 2015-05-31 ENCOUNTER — Encounter: Payer: Self-pay | Admitting: Neurology

## 2015-06-05 ENCOUNTER — Ambulatory Visit (INDEPENDENT_AMBULATORY_CARE_PROVIDER_SITE_OTHER): Payer: Self-pay | Admitting: Neurology

## 2015-06-05 ENCOUNTER — Ambulatory Visit (INDEPENDENT_AMBULATORY_CARE_PROVIDER_SITE_OTHER): Payer: BLUE CROSS/BLUE SHIELD | Admitting: Neurology

## 2015-06-05 DIAGNOSIS — M79603 Pain in arm, unspecified: Secondary | ICD-10-CM

## 2015-06-05 DIAGNOSIS — R202 Paresthesia of skin: Secondary | ICD-10-CM

## 2015-06-05 DIAGNOSIS — Z0289 Encounter for other administrative examinations: Secondary | ICD-10-CM

## 2015-06-05 NOTE — Progress Notes (Signed)
  [facility]    Provider:  Dr Lucia Gaskins Referring Provider: Charlette Caffey, MD Primary Care Physician:  Charlette Caffey, MD  HPI:  Heather Preston is a 34 y.o. female here as a referral from Dr. Louis Meckel for arm pain. She has weakness and tingling in both arms, in no dermatomal or myotomal distribution, it involves the entirety of the arms. She wakes up and her hands will be clenched to her chest in a fetal position and she has to pry her fingers apart. She has neck pain with radicular symptoms. She has been diagnosed with Fibromyalgia. She can barely bathe herself because of her arm symptoms. She is having a lot of mental confusion. Even water from the shower hurts her skin.   Summary  Nerve conduction studies were performed on the bilateral upper extremities:  The bilateral Median motor nerves showed normal conductions with normal F Wave latencies The bilateral Ulnar motor nerves showed normal conductions with normal F Wave latencies The bilateral second-digit Median sensory nerves were within normal limits The bilateral fifth-digit Ulnar sensory nerves were within normal limits  EMG Needle study was performed on selected bilateral upper extremity muscles:   The Deltoid, Triceps, Pronator Teres, Flexor Digitorum profundus, Opponens Pollicis, First Dorsal interosseous  muscles and C7/C8 paraspinal muscles were within normal limits.  Conclusion: This is a normal study. No electrophysiologic evidence for ulnar or median neuropathy, peripheral polyneuropathy or cervical radiculopathy.   [name], MD  [facility] [address] [place], [place] [address]  Phone [phone] Fax [phone]

## 2015-06-05 NOTE — Patient Instructions (Signed)
Remember to drink plenty of fluid, eat healthy meals and do not skip any meals. Try to eat protein with a every meal and eat a healthy snack such as fruit or nuts in between meals. Try to keep a regular sleep-wake schedule and try to exercise daily, particularly in the form of walking, 20-30 minutes a day, if you can.   Our phone number is [phone]. We also have an after hours call service for urgent matters and there is a physician on-call for urgent questions. For any emergencies you know to call 911 or go to the nearest emergency room  Office Locations   [facility] [address] [place], [place] [address]  [phone] (Office)

## 2015-06-07 NOTE — Procedures (Signed)
[facility]    Provider:  Dr Lucia Gaskins Referring Provider: Charlette Caffey, MD Primary Care Physician:  Charlette Caffey, MD  HPI:  Heather Preston is a 34 y.o. female here as a referral from Dr. Louis Meckel for arm pain. She has weakness and tingling in both arms, in no dermatomal or myotomal distribution, it involves the entirety of the arms. She wakes up and her hands will be clenched to her chest in a fetal position and she has to pry her fingers apart. She has neck pain with radicular symptoms. She has been diagnosed with Fibromyalgia. She can barely bathe herself because of her arm symptoms. She is having a lot of mental confusion. Even water from the shower hurts her skin.   Summary  Nerve conduction studies were performed on the bilateral upper extremities:  The bilateral Median motor nerves showed normal conductions with normal F Wave latencies The bilateral Ulnar motor nerves showed normal conductions with normal F Wave latencies The bilateral second-digit Median sensory nerves were within normal limits The bilateral fifth-digit Ulnar sensory nerves were within normal limits  EMG Needle study was performed on selected bilateral upper extremity muscles:   The Deltoid, Triceps, Pronator Teres, Flexor Digitorum profundus, Opponens Pollicis, First Dorsal interosseous  muscles and C7/C8 paraspinal muscles were within normal limits.  Conclusion: This is a normal study. No electrophysiologic evidence for ulnar or median neuropathy, peripheral polyneuropathy or cervical radiculopathy.   [name], MD  [facility] [address] [place], [place] [address]  Phone [phone] Fax [phone]

## 2015-06-07 NOTE — Progress Notes (Signed)
See procedure note.

## 2015-07-24 ENCOUNTER — Other Ambulatory Visit: Payer: Self-pay | Admitting: Obstetrics & Gynecology

## 2015-07-26 NOTE — Anesthesia Preprocedure Evaluation (Addendum)
Anesthesia Evaluation   Patient awake    Reviewed: Allergy & Precautions, NPO status , Patient's Chart, lab work & pertinent test results, reviewed documented beta blocker date and time   Airway Mallampati: II  TM Distance: >3 FB Neck ROM: Full    Dental no notable dental hx. (+) Teeth Intact, Caps   Pulmonary Current Smoker,    Pulmonary exam normal breath sounds clear to auscultation       Cardiovascular hypertension, negative cardio ROS Normal cardiovascular exam Rhythm:Regular Rate:Normal     Neuro/Psych Anxiety Depression Bipolar Disorder ADD, suicide attempt in past   GI/Hepatic negative GI ROS, Neg liver ROS,   Endo/Other  negative endocrine ROS  Renal/GU      Musculoskeletal   Abdominal   Peds  Hematology 12/35   Anesthesia Other Findings   Reproductive/Obstetrics                           Anesthesia Physical Anesthesia Plan  ASA: II  Anesthesia Plan: General   Post-op Pain Management:    Induction: Intravenous  Airway Management Planned: Oral ETT  Additional Equipment:   Intra-op Plan:   Post-operative Plan: Extubation in OR  Informed Consent: I have reviewed the patients History and Physical, chart, labs and discussed the procedure including the risks, benefits and alternatives for the proposed anesthesia with the patient or authorized representative who has indicated his/her understanding and acceptance.   Dental advisory given  Plan Discussed with: Anesthesiologist, CRNA and Surgeon  Anesthesia Plan Comments: (Check AM PG test)      Anesthesia Quick Evaluation

## 2015-07-28 ENCOUNTER — Encounter (HOSPITAL_COMMUNITY)
Admission: RE | Admit: 2015-07-28 | Discharge: 2015-07-28 | Disposition: A | Discharge status: Home / Self Care | Payer: BLUE CROSS/BLUE SHIELD | Source: Ambulatory Visit | Attending: Obstetrics & Gynecology | Admitting: Obstetrics & Gynecology

## 2015-07-28 ENCOUNTER — Encounter (HOSPITAL_COMMUNITY): Payer: Self-pay

## 2015-07-28 DIAGNOSIS — I10 Essential (primary) hypertension: Secondary | ICD-10-CM | POA: Diagnosis not present

## 2015-07-28 DIAGNOSIS — F17210 Nicotine dependence, cigarettes, uncomplicated: Secondary | ICD-10-CM | POA: Diagnosis not present

## 2015-07-28 DIAGNOSIS — N840 Polyp of corpus uteri: Secondary | ICD-10-CM | POA: Diagnosis not present

## 2015-07-28 DIAGNOSIS — M797 Fibromyalgia: Secondary | ICD-10-CM | POA: Diagnosis not present

## 2015-07-28 HISTORY — DX: Headache, unspecified: R51.9

## 2015-07-28 HISTORY — DX: Anemia, unspecified: D64.9

## 2015-07-28 HISTORY — DX: Cardiac arrhythmia, unspecified: I49.9

## 2015-07-28 HISTORY — DX: Essential (primary) hypertension: I10

## 2015-07-28 HISTORY — DX: Headache: R51

## 2015-07-28 HISTORY — DX: Family history of other specified conditions: Z84.89

## 2015-07-28 HISTORY — DX: Post-traumatic stress disorder, unspecified: F43.10

## 2015-07-28 HISTORY — DX: Fibromyalgia: M79.7

## 2015-07-28 HISTORY — DX: Reserved for inherently not codable concepts without codable children: IMO0001

## 2015-07-28 LAB — COMPREHENSIVE METABOLIC PANEL
ALBUMIN: 4.5 g/dL (ref 3.5–5.0)
ALT: 14 U/L (ref 14–54)
AST: 18 U/L (ref 15–41)
Alkaline Phosphatase: 60 U/L (ref 38–126)
Anion gap: 5 (ref 5–15)
BUN: 10 mg/dL (ref 6–20)
CHLORIDE: 104 mmol/L (ref 101–111)
CO2: 28 mmol/L (ref 22–32)
Calcium: 9.3 mg/dL (ref 8.9–10.3)
Creatinine, Ser: 0.58 mg/dL (ref 0.44–1.00)
GFR calc Af Amer: >60 mL/min (ref >60)
GFR calc non Af Amer: >60 mL/min (ref >60)
GLUCOSE: 83 mg/dL (ref 65–99)
POTASSIUM: 3.9 mmol/L (ref 3.5–5.1)
Sodium: 137 mmol/L (ref 135–145)
Total Bilirubin: 0.5 mg/dL (ref 0.3–1.2)
Total Protein: 7.6 g/dL (ref 6.5–8.1)

## 2015-07-28 LAB — CBC
HCT: 35.7 % — ABNORMAL LOW (ref 36.0–46.0)
Hemoglobin: 12.1 g/dL (ref 12.0–15.0)
MCH: 29.4 pg (ref 26.0–34.0)
MCHC: 33.9 g/dL (ref 30.0–36.0)
MCV: 86.9 fL (ref 78.0–100.0)
PLATELETS: 296 10*3/uL (ref 150–400)
RBC: 4.11 MIL/uL (ref 3.87–5.11)
RDW: 13.4 % (ref 11.5–15.5)
WBC: 6.3 10*3/uL (ref 4.0–10.5)

## 2015-07-28 NOTE — Patient Instructions (Addendum)
Your procedure is scheduled on:07/31/15  Enter through the Main Entrance at :1:15 pm Pick up desk phone and dial [phone] and inform us of your arrival.  Please call [phone] if you have any problems the morning of surgery.  Remember: Do not eat food after midnight:Sunday Clear liquids are ok until:1030 am   You may brush your teeth the morning of surgery.  Take these meds the morning of surgery with a sip of water: Klonopin, Tramadol, Cogentin  DO NOT wear jewelry, eye make-up, lipstick,body lotion, or dark fingernail polish.  (Polished toes are ok) You may wear deodorant.  If you are to be admitted after surgery, leave suitcase in car until your room has been assigned. Patients discharged on the day of surgery will not be allowed to drive home. Wear loose fitting, comfortable clothes for your ride home.

## 2015-07-31 ENCOUNTER — Encounter (HOSPITAL_COMMUNITY): Payer: Self-pay | Admitting: *Deleted

## 2015-07-31 ENCOUNTER — Ambulatory Visit (HOSPITAL_COMMUNITY): Payer: BLUE CROSS/BLUE SHIELD | Admitting: Anesthesiology

## 2015-07-31 ENCOUNTER — Ambulatory Visit (HOSPITAL_COMMUNITY)
Admission: RE | Admit: 2015-07-31 | Discharge: 2015-07-31 | Disposition: A | Discharge status: Home / Self Care | Payer: BLUE CROSS/BLUE SHIELD | Source: Ambulatory Visit | Attending: Obstetrics & Gynecology | Admitting: Obstetrics & Gynecology

## 2015-07-31 ENCOUNTER — Encounter (HOSPITAL_COMMUNITY)
Admission: RE | Disposition: A | Discharge status: Home / Self Care | Payer: Self-pay | Source: Ambulatory Visit | Attending: Obstetrics & Gynecology

## 2015-07-31 DIAGNOSIS — M797 Fibromyalgia: Secondary | ICD-10-CM | POA: Insufficient documentation

## 2015-07-31 DIAGNOSIS — N840 Polyp of corpus uteri: Secondary | ICD-10-CM | POA: Insufficient documentation

## 2015-07-31 DIAGNOSIS — I10 Essential (primary) hypertension: Secondary | ICD-10-CM | POA: Insufficient documentation

## 2015-07-31 DIAGNOSIS — F17210 Nicotine dependence, cigarettes, uncomplicated: Secondary | ICD-10-CM | POA: Insufficient documentation

## 2015-07-31 HISTORY — PX: DILITATION & CURRETTAGE/HYSTROSCOPY WITH VERSAPOINT RESECTION: SHX5571

## 2015-07-31 LAB — PREGNANCY, URINE: Preg Test, Ur: NEGATIVE

## 2015-07-31 SURGERY — DILATATION & CURETTAGE/HYSTEROSCOPY WITH VERSAPOINT RESECTION
Anesthesia: General

## 2015-07-31 MED ORDER — ONDANSETRON HCL 4 MG/2ML IJ SOLN
INTRAMUSCULAR | Status: AC
  Filled 2015-07-31: qty 2

## 2015-07-31 MED ORDER — SCOPOLAMINE 1 MG/3DAYS TD PT72
MEDICATED_PATCH | TRANSDERMAL | Status: AC
  Administered 2015-07-31: 1.5 mg
  Filled 2015-07-31: qty 1

## 2015-07-31 MED ORDER — ONDANSETRON HCL 4 MG/2ML IJ SOLN
INTRAMUSCULAR | Status: DC | PRN
  Administered 2015-07-31: 4 mg via INTRAVENOUS

## 2015-07-31 MED ORDER — BUPIVACAINE HCL (PF) 0.25 % IJ SOLN
INTRAMUSCULAR | Status: AC
  Filled 2015-07-31: qty 10

## 2015-07-31 MED ORDER — MIDAZOLAM HCL 2 MG/2ML IJ SOLN
INTRAMUSCULAR | Status: DC | PRN
  Administered 2015-07-31: 2 mg via INTRAVENOUS

## 2015-07-31 MED ORDER — LIDOCAINE HCL (CARDIAC) 20 MG/ML IV SOLN
INTRAVENOUS | Status: DC | PRN
  Administered 2015-07-31: 50 mg via INTRAVENOUS

## 2015-07-31 MED ORDER — PROPOFOL 10 MG/ML IV BOLUS
INTRAVENOUS | Status: AC
  Filled 2015-07-31: qty 20

## 2015-07-31 MED ORDER — PROMETHAZINE HCL 25 MG/ML IJ SOLN: 6.2500 mg | INTRAMUSCULAR | Status: DC | PRN

## 2015-07-31 MED ORDER — FENTANYL CITRATE (PF) 100 MCG/2ML IJ SOLN
INTRAMUSCULAR | Status: DC | PRN
  Administered 2015-07-31: 100 ug via INTRAVENOUS

## 2015-07-31 MED ORDER — KETOROLAC TROMETHAMINE 30 MG/ML IJ SOLN
INTRAMUSCULAR | Status: AC
  Filled 2015-07-31: qty 1

## 2015-07-31 MED ORDER — LIDOCAINE HCL (CARDIAC) 20 MG/ML IV SOLN
INTRAVENOUS | Status: AC
  Filled 2015-07-31: qty 5

## 2015-07-31 MED ORDER — DEXAMETHASONE SODIUM PHOSPHATE 10 MG/ML IJ SOLN
INTRAMUSCULAR | Status: DC | PRN
  Administered 2015-07-31: 4 mg via INTRAVENOUS

## 2015-07-31 MED ORDER — GLYCOPYRROLATE 0.2 MG/ML IJ SOLN
INTRAMUSCULAR | Status: AC
  Filled 2015-07-31: qty 1

## 2015-07-31 MED ORDER — PROPOFOL 10 MG/ML IV BOLUS
INTRAVENOUS | Status: DC | PRN
  Administered 2015-07-31: 150 mg via INTRAVENOUS

## 2015-07-31 MED ORDER — LACTATED RINGERS IV SOLN
INTRAVENOUS | Status: DC
  Administered 2015-07-31 (×2): via INTRAVENOUS

## 2015-07-31 MED ORDER — GLYCOPYRROLATE 0.2 MG/ML IJ SOLN
INTRAMUSCULAR | Status: DC | PRN
  Administered 2015-07-31: 0.2 mg via INTRAVENOUS

## 2015-07-31 MED ORDER — FENTANYL CITRATE (PF) 250 MCG/5ML IJ SOLN
INTRAMUSCULAR | Status: AC
  Filled 2015-07-31: qty 25

## 2015-07-31 MED ORDER — FENTANYL CITRATE (PF) 100 MCG/2ML IJ SOLN: 25.0000 ug | INTRAMUSCULAR | Status: DC | PRN

## 2015-07-31 MED ORDER — DEXAMETHASONE SODIUM PHOSPHATE 4 MG/ML IJ SOLN
INTRAMUSCULAR | Status: AC
  Filled 2015-07-31: qty 1

## 2015-07-31 MED ORDER — MEPERIDINE HCL 25 MG/ML IJ SOLN: 6.2500 mg | INTRAMUSCULAR | Status: DC | PRN

## 2015-07-31 MED ORDER — CHLOROPROCAINE HCL 1 % IJ SOLN
INTRAMUSCULAR | Status: AC
  Filled 2015-07-31: qty 30

## 2015-07-31 MED ORDER — KETOROLAC TROMETHAMINE 30 MG/ML IJ SOLN
INTRAMUSCULAR | Status: DC | PRN
  Administered 2015-07-31: 30 mg via INTRAVENOUS

## 2015-07-31 MED ORDER — CEFAZOLIN SODIUM-DEXTROSE 2-3 GM-% IV SOLR: 2.0000 g | INTRAVENOUS | Status: DC

## 2015-07-31 MED ORDER — CHLOROPROCAINE HCL 1 % IJ SOLN
INTRAMUSCULAR | Status: DC | PRN
  Administered 2015-07-31: 20 mL

## 2015-07-31 MED ORDER — ROCURONIUM BROMIDE 100 MG/10ML IV SOLN
INTRAVENOUS | Status: AC
  Filled 2015-07-31: qty 1

## 2015-07-31 MED ORDER — MIDAZOLAM HCL 2 MG/2ML IJ SOLN
INTRAMUSCULAR | Status: AC
  Filled 2015-07-31: qty 4

## 2015-07-31 MED ORDER — OXYCODONE-ACETAMINOPHEN 7.5-325 MG PO TABS: 1.0000 | ORAL_TABLET | ORAL | Status: DC | PRN

## 2015-07-31 MED ORDER — CEFAZOLIN SODIUM-DEXTROSE 2-3 GM-% IV SOLR
INTRAVENOUS | Status: AC
  Filled 2015-07-31: qty 50

## 2015-07-31 SURGICAL SUPPLY — 30 items
APPLICATOR COTTON TIP 6IN STRL (MISCELLANEOUS) IMPLANT
CANISTER SUCT 3000ML (MISCELLANEOUS) ×4 IMPLANT
CATH ROBINSON RED A/P 16FR (CATHETERS) ×4 IMPLANT
CLOTH BEACON ORANGE TIMEOUT ST (SAFETY) ×4 IMPLANT
CONTAINER PREFILL 10% NBF 60ML (FORM) ×8 IMPLANT
DRSG COVADERM PLUS 2X2 (GAUZE/BANDAGES/DRESSINGS) IMPLANT
DRSG OPSITE POSTOP 3X4 (GAUZE/BANDAGES/DRESSINGS) IMPLANT
ELECT REM PT RETURN 9FT ADLT (ELECTROSURGICAL)
ELECTRODE REM PT RTRN 9FT ADLT (ELECTROSURGICAL) IMPLANT
ELECTRODE ROLLER VERSAPOINT (ELECTRODE) IMPLANT
ELECTRODE RT ANGLE VERSAPOINT (CUTTING LOOP) ×4 IMPLANT
GLOVE BIO SURGEON STRL SZ 6.5 (GLOVE) ×3 IMPLANT
GLOVE BIO SURGEONS STRL SZ 6.5 (GLOVE) ×1
GLOVE BIOGEL PI IND STRL 7.0 (GLOVE) ×4 IMPLANT
GLOVE BIOGEL PI INDICATOR 7.0 (GLOVE) ×4
GOWN STRL REUS W/TWL LRG LVL3 (GOWN DISPOSABLE) ×8 IMPLANT
LIQUID BAND (GAUZE/BANDAGES/DRESSINGS) IMPLANT
PACK LAPAROSCOPY BASIN (CUSTOM PROCEDURE TRAY) IMPLANT
PACK VAGINAL MINOR WOMEN LF (CUSTOM PROCEDURE TRAY) ×4 IMPLANT
PAD OB MATERNITY 4.3X12.25 (PERSONAL CARE ITEMS) ×4 IMPLANT
PAD POSITIONING PINK XL (MISCELLANEOUS) ×4 IMPLANT
PENCIL BUTTON HOLSTER BLD 10FT (ELECTRODE) IMPLANT
SUT MNCRL AB 4-0 PS2 18 (SUTURE) IMPLANT
SUT VICRYL 0 UR6 27IN ABS (SUTURE) IMPLANT
TOWEL OR 17X24 6PK STRL BLUE (TOWEL DISPOSABLE) ×8 IMPLANT
TROCAR BALLN 12MMX100 BLUNT (TROCAR) IMPLANT
TUBING AQUILEX INFLOW (TUBING) ×4 IMPLANT
TUBING AQUILEX OUTFLOW (TUBING) ×4 IMPLANT
WARMER LAPAROSCOPE (MISCELLANEOUS) IMPLANT
WATER STERILE IRR 1000ML POUR (IV SOLUTION) IMPLANT

## 2015-07-31 NOTE — Discharge Summary (Signed)
  Physician Discharge Summary  Patient ID: [name] MRN: [id number] DOB/AGE: [birth year] 33 y.o.  Admit date: 07/31/2015 Discharge date: 07/31/2015  Admission Diagnoses:  Endometrial polyp  Discharge Diagnoses:  Endometrial polyp        Active Problems:   * No active hospital problems. *   Discharged Condition: good  Hospital Course: Outpatient  Consults: None  Treatments: surgery: Hysteroscopy with Versapoint resection and D+C  Disposition: 01-Home or Self Care     Medication List    TAKE these medications        amitriptyline 50 MG tablet  Commonly known as:  ELAVIL  Take 50 mg by mouth at bedtime.     amphetamine-dextroamphetamine 20 MG tablet  Commonly known as:  ADDERALL  Take 20 mg by mouth 2 (two) times daily.     benztropine 0.5 MG tablet  Commonly known as:  COGENTIN  Take 0.5 mg by mouth 2 (two) times daily.     ciprofloxacin 500 MG tablet  Commonly known as:  CIPRO  Take 500 mg by mouth 2 (two) times daily.     clonazePAM 1 MG tablet  Commonly known as:  KLONOPIN  Take 0.5-1 mg by mouth 3 (three) times daily as needed for anxiety. TAKE 1/2 TAB TO 1 TAB DAILY     cyclobenzaprine 5 MG tablet  Commonly known as:  FLEXERIL  Take 5 mg by mouth 3 (three) times daily as needed for muscle spasms.     DULoxetine 30 MG capsule  Commonly known as:  CYMBALTA  Take 30 mg by mouth daily.     ibuprofen 800 MG tablet  Commonly known as:  ADVIL,MOTRIN  Take 800 mg by mouth every 8 (eight) hours as needed for moderate pain.     LINZESS 290 MCG Caps capsule  Generic drug:  Linaclotide  Take 290 mcg by mouth daily.     oxyCODONE-acetaminophen 7.5-325 MG tablet  Commonly known as:  PERCOCET  Take 1 tablet by mouth every 4 (four) hours as needed for severe pain.     prazosin 2 MG capsule  Commonly known as:  MINIPRESS  Take 6 mg by mouth at bedtime.     risperidone 4 MG tablet  Commonly known as:  RISPERDAL  Take 4 mg by mouth at bedtime.     traMADol 50 MG tablet  Commonly known as:  ULTRAM  Take by mouth every 6 (six) hours as needed.           Follow-up Information    Follow up with Caitlain Tweed,MARIE-LYNE, MD In 3 weeks.   Specialty:  Obstetrics and Gynecology   Contact information:   [address] [place] [address] [phone]       Signed: Genia Del, MD 07/31/2015, 3:49 PM

## 2015-07-31 NOTE — Discharge Instructions (Signed)
Hysteroscopy, Care After  Refer to this sheet in the next few weeks. These instructions provide you with information on caring for yourself after your procedure. Your health care provider may also give you more specific instructions. Your treatment has been planned according to current medical practices, but problems sometimes occur. Call your health care provider if you have any problems or questions after your procedure.   WHAT TO EXPECT AFTER THE PROCEDURE  After your procedure, it is typical to have the following:  · You may have some cramping. This normally lasts for a couple days.  · You may have bleeding. This can vary from light spotting for a few days to menstrual-like bleeding for 3-7 days.  HOME CARE INSTRUCTIONS  · Rest for the first 1-2 days after the procedure.  · Only take over-the-counter or prescription medicines as directed by your health care provider. Do not take aspirin. It can increase the chances of bleeding.  · Take showers instead of baths for 2 weeks or as directed by your health care provider.  · Do not drive for 24 hours or as directed.  · Do not drink alcohol while taking pain medicine.  · Do not use tampons, douche, or have sexual intercourse for 2 weeks or until your health care provider says it is okay.  · Take your temperature twice a day for 4-5 days. Write it down each time.  · Follow your health care provider's advice about diet, exercise, and lifting.  · If you develop constipation, you may:    Take a mild laxative if your health care provider approves.    Add bran foods to your diet.    Drink enough fluids to keep your urine clear or pale yellow.  · Try to have someone with you or available to you for the first 24-48 hours, especially if you were given a general anesthetic.  · Follow up with your health care provider as directed.  SEEK MEDICAL CARE IF:  · You feel dizzy or lightheaded.  · You feel sick to your stomach (nauseous).  · You have abnormal vaginal discharge.  · You  have a rash.  · You have pain that is not controlled with medicine.  SEEK IMMEDIATE MEDICAL CARE IF:  · You have bleeding that is heavier than a normal menstrual period.  · You have a fever.  · You have increasing cramps or pain, not controlled with medicine.  · You have new belly (abdominal) pain.  · You pass out.  · You have pain in the tops of your shoulders (shoulder strap areas).  · You have shortness of breath.     This information is not intended to replace advice given to you by your health care provider. Make sure you discuss any questions you have with your health care provider.     Document Released: 07/28/2013 Document Reviewed: 07/28/2013  Elsevier Interactive Patient Education ©2016 [employer] Inc[employer]

## 2015-07-31 NOTE — Anesthesia Procedure Notes (Signed)
Procedure Name: LMA Insertion Date/Time: 07/31/2015 3:05 PM Performed by: Graciela Husbands Pre-anesthesia Checklist: Patient identified, Emergency Drugs available, Suction available, Patient being monitored and Timeout performed Patient Re-evaluated:Patient Re-evaluated prior to inductionOxygen Delivery Method: Circle system utilized Preoxygenation: Pre-oxygenation with 100% oxygen Intubation Type: IV induction LMA: LMA inserted LMA Size: 3.0 Number of attempts: 1 Placement Confirmation: positive ETCO2 and breath sounds checked- equal and bilateral Tube secured with: Tape Dental Injury: Teeth and Oropharynx as per pre-operative assessment

## 2015-07-31 NOTE — Anesthesia Postprocedure Evaluation (Signed)
  Anesthesia Post-op Note  Patient: Heather Preston  Procedure(s) Performed: Procedure(s): DILATATION & CURETTAGE/HYSTEROSCOPY WITH VERSAPOINT RESECTION (N/A)  Patient Location: PACU  Anesthesia Type:General  Level of Consciousness: awake, alert  and oriented  Airway and Oxygen Therapy: Patient Spontanous Breathing  Post-op Pain: mild  Post-op Assessment: Post-op Vital signs reviewed, Patient's Cardiovascular Status Stable, Respiratory Function Stable, Patent Airway, No signs of Nausea or vomiting and Pain level controlled              Post-op Vital Signs: Reviewed and stable  Last Vitals:  Filed Vitals:   07/31/15 1318  BP: 130/100  Pulse: 100  Temp: 36.7 C  Resp: 20    Complications: No apparent anesthesia complications

## 2015-07-31 NOTE — Op Note (Signed)
07/31/2015  3:35 PM  PATIENT:  Heather Preston  34 y.o. female  PRE-OPERATIVE DIAGNOSIS:  Endometrial Polyp  POST-OPERATIVE DIAGNOSIS:  Endometrial Polyp  PROCEDURE:  Procedure(s): DILATATION & CURETTAGE/HYSTEROSCOPY WITH VERSAPOINT RESECTION  SURGEON:  Surgeon(s): Genia Del, MD  ASSISTANTS: none   ANESTHESIA:   general   PROCEDURE:  Under general anesthesia with laryngeal mask the patient is an lithotomy position.  She is prepped with Betadine on the suprapubic, vulvar and vaginal areas. The bladder is catheterized. She is draped as usual.  The vaginal exam reveals an anteverted uterus normal volume no adnexal mass.  The speculum is inserted in the vagina and the anterior lip of the cervix was grasped with a tenaculum. A paracervical block is done with Nesacaine 1% a total of 20 cc at 4 and 8:00. Dilation of the cervix with Aker dilators up to #33 without difficulty. The operative hysteroscope is inserted in the intrauterine cavity with the VersaPoint loop. Pictures are taken of the intrauterine cavity showing an endometrial polyp of about 1.5 cm attached to the right postero-lateral wall of the cavity.  Both ostia are well visualized.  The polyp was resected at its base using the VersaPoint loop.  The polyp is easily removed from the intrauterine cavity as we remove the hysteroscope.   Systematic curettage of the intrauterine cavity is done with the sharp curet on all surfaces. Both specimens are sent together to pathology.  We reintroduced the hysteroscope in the intrauterine cavity.  Normal intrauterine cavity with both ostia well visualized again.  Pictures taken of the intrauterine cavity.  Good hemostasis.  Hysteroscope removed. The tenaculum on the cervix was removed as well and then the speculum. The patient was brought to recovery room in good and stable status.  ESTIMATED BLOOD LOSS:  25 cc  FLUID DEFICIT:  75 cc   Intake/Output Summary (Last 24 hours) at 07/31/15  1535 Last data filed at 07/31/15 1525  Gross per 24 hour  Intake      0 ml  Output    125 ml  Net   -125 ml     BLOOD ADMINISTERED:none   LOCAL MEDICATIONS USED:  Nesacaine 1% 20 cc for Paracervical block  SPECIMEN:  Source of Specimen:  Resection material of endometrial polyp and endometrial curettings  DISPOSITION OF SPECIMEN:  PATHOLOGY  COUNTS:  YES  PLAN OF CARE: Transfer to PACU  Genia Del MD  07/31/2015 at 3:35 pm

## 2015-07-31 NOTE — H&P (Signed)
Heather Preston is an 34 y.o. female  G0  RP:  IU Polyp for [facility] Resection, D+C.  No longer desires Sterilization.  Pertinent Gynecological History: Menses: flow is moderate and reg qmth Contraception: condoms Blood transfusions: none Sexually transmitted diseases: no past history Previous GYN Procedures: None  Last mammogram: None  Last pap: normal  OB History: G0   Menstrual History:  No LMP recorded.    Past Medical History  Diagnosis Date  . Kidney infection   . UTI (lower urinary tract infection)   . ADD (attention deficit disorder)   . Depression   . Bipolar disorder (HCC)   . PTSD (post-traumatic stress disorder)   . Anemia   . Fibromyalgia   . Headache     migraines  . Shortness of breath dyspnea     related to pain  . Dysrhythmia     palpitations  . Anxiety     panic attacks  . Hypertension   . Family history of adverse reaction to anesthesia     nausea with mother    Past Surgical History  Procedure Laterality Date  . Urethra surgery    . Breast surgery      implants  . Wisdom tooth extraction      History reviewed. No pertinent family history.  Social History:  reports that she has been smoking Cigarettes.  She has been smoking about 1.00 pack per day. She has never used smokeless tobacco. She reports that she drinks alcohol. She reports that she does not use illicit drugs.  Allergies:  Allergies  Allergen Reactions  . Chlorhexidine Gluconate Hives  . Lyrica [Pregabalin] Other (See Comments)    Passed out    Prescriptions prior to admission  Medication Sig Dispense Refill Last Dose  . amitriptyline (ELAVIL) 50 MG tablet Take 50 mg by mouth at bedtime.     . benztropine (COGENTIN) 0.5 MG tablet Take 0.5 mg by mouth 2 (two) times daily.     . ciprofloxacin (CIPRO) 500 MG tablet Take 500 mg by mouth 2 (two) times daily.     . clonazePAM (KLONOPIN) 1 MG tablet Take 0.5-1 mg by mouth 3 (three) times daily as needed for anxiety. TAKE 1/2 TAB TO 1  TAB DAILY  0 07/31/2015 at 0800  . cyclobenzaprine (FLEXERIL) 5 MG tablet Take 5 mg by mouth 3 (three) times daily as needed for muscle spasms.     . DULoxetine (CYMBALTA) 30 MG capsule Take 30 mg by mouth daily.     . Linaclotide (LINZESS) 290 MCG CAPS capsule Take 290 mcg by mouth daily.     . prazosin (MINIPRESS) 2 MG capsule Take 6 mg by mouth at bedtime.   07/30/2015 at 2200  . risperidone (RISPERDAL) 4 MG tablet Take 4 mg by mouth at bedtime.     . traMADol (ULTRAM) 50 MG tablet Take by mouth every 6 (six) hours as needed.   07/31/2015 at 1100  . amphetamine-dextroamphetamine (ADDERALL) 20 MG tablet Take 20 mg by mouth 2 (two) times daily.   0 Taking  . ibuprofen (ADVIL,MOTRIN) 800 MG tablet Take 800 mg by mouth every 8 (eight) hours as needed for moderate pain.   0 Taking    ROS  Blood pressure 130/100, pulse 100, temperature 98.1 F (36.7 C), temperature source Oral, resp. rate 20, SpO2 100 %. Physical Exam   SonoHysto:  1.4 cm IU lesion c/w polyp  Results for orders placed or performed during the hospital encounter of 07/31/15 (from  the past 24 hour(s))  Pregnancy, urine     Status: None   Collection Time: 07/31/15  1:00 PM  Result Value Ref Range   Preg Test, Ur NEGATIVE NEGATIVE    No results found.  Assessment/Plan: IU Polyp for [facility] Resection, D+C.  Surgery and risks reviewed.  Note that patient changed her mind about sterilization procedure and requested a Nexplanon insertion which will be inserted at [employer].  Deepak Bless,MARIE-LYNE 07/31/2015, 2:40 PM

## 2015-07-31 NOTE — Transfer of Care (Signed)
Immediate Anesthesia Transfer of Care Note  Patient: Heather Preston  Procedure(s) Performed: Procedure(s): DILATATION & CURETTAGE/HYSTEROSCOPY WITH VERSAPOINT RESECTION (N/A)  Patient Location: PACU  Anesthesia Type:General  Level of Consciousness: awake, alert  and oriented  Airway & Oxygen Therapy: Patient Spontanous Breathing and Patient connected to nasal cannula oxygen  Post-op Assessment: Report given to RN and Post -op Vital signs reviewed and stable  Post vital signs: Reviewed and stable  Last Vitals:  Filed Vitals:   07/31/15 1318  BP: 130/100  Pulse: 100  Temp: 36.7 C  Resp: 20    Complications: No apparent anesthesia complications

## 2015-08-01 ENCOUNTER — Encounter (HOSPITAL_COMMUNITY): Payer: Self-pay | Admitting: Obstetrics & Gynecology

## 2016-03-23 ENCOUNTER — Emergency Department (HOSPITAL_COMMUNITY)
Admission: EM | Admit: 2016-03-23 | Discharge: 2016-03-24 | Disposition: A | Discharge status: Home / Self Care | Payer: BLUE CROSS/BLUE SHIELD | Attending: Emergency Medicine | Admitting: Emergency Medicine

## 2016-03-23 DIAGNOSIS — F329 Major depressive disorder, single episode, unspecified: Secondary | ICD-10-CM | POA: Insufficient documentation

## 2016-03-23 DIAGNOSIS — R45851 Suicidal ideations: Secondary | ICD-10-CM

## 2016-03-23 DIAGNOSIS — F4310 Post-traumatic stress disorder, unspecified: Secondary | ICD-10-CM | POA: Diagnosis present

## 2016-03-23 DIAGNOSIS — I10 Essential (primary) hypertension: Secondary | ICD-10-CM | POA: Insufficient documentation

## 2016-03-23 DIAGNOSIS — F17210 Nicotine dependence, cigarettes, uncomplicated: Secondary | ICD-10-CM | POA: Insufficient documentation

## 2016-03-23 DIAGNOSIS — F3160 Bipolar disorder, current episode mixed, unspecified: Secondary | ICD-10-CM | POA: Diagnosis present

## 2016-03-24 ENCOUNTER — Encounter (HOSPITAL_COMMUNITY): Payer: Self-pay | Admitting: *Deleted

## 2016-03-24 ENCOUNTER — Inpatient Hospital Stay (HOSPITAL_COMMUNITY)
Admission: AD | Admit: 2016-03-24 | Discharge: 2016-03-27 | DRG: 885 | Disposition: A | Discharge status: Home / Self Care | Payer: BLUE CROSS/BLUE SHIELD | Source: Intra-hospital | Attending: Psychiatry | Admitting: Psychiatry

## 2016-03-24 ENCOUNTER — Encounter (HOSPITAL_COMMUNITY): Payer: Self-pay | Admitting: Oncology

## 2016-03-24 DIAGNOSIS — F3160 Bipolar disorder, current episode mixed, unspecified: Secondary | ICD-10-CM | POA: Diagnosis not present

## 2016-03-24 DIAGNOSIS — K5909 Other constipation: Secondary | ICD-10-CM | POA: Diagnosis present

## 2016-03-24 DIAGNOSIS — I10 Essential (primary) hypertension: Secondary | ICD-10-CM | POA: Diagnosis present

## 2016-03-24 DIAGNOSIS — F315 Bipolar disorder, current episode depressed, severe, with psychotic features: Secondary | ICD-10-CM | POA: Diagnosis present

## 2016-03-24 DIAGNOSIS — R45851 Suicidal ideations: Secondary | ICD-10-CM | POA: Diagnosis present

## 2016-03-24 DIAGNOSIS — F4310 Post-traumatic stress disorder, unspecified: Secondary | ICD-10-CM | POA: Diagnosis present

## 2016-03-24 DIAGNOSIS — R109 Unspecified abdominal pain: Secondary | ICD-10-CM

## 2016-03-24 DIAGNOSIS — N390 Urinary tract infection, site not specified: Secondary | ICD-10-CM | POA: Diagnosis present

## 2016-03-24 DIAGNOSIS — F3130 Bipolar disorder, current episode depressed, mild or moderate severity, unspecified: Secondary | ICD-10-CM | POA: Diagnosis not present

## 2016-03-24 DIAGNOSIS — F410 Panic disorder [episodic paroxysmal anxiety] without agoraphobia: Secondary | ICD-10-CM | POA: Diagnosis present

## 2016-03-24 DIAGNOSIS — M797 Fibromyalgia: Secondary | ICD-10-CM | POA: Diagnosis present

## 2016-03-24 DIAGNOSIS — F909 Attention-deficit hyperactivity disorder, unspecified type: Secondary | ICD-10-CM | POA: Diagnosis present

## 2016-03-24 DIAGNOSIS — K5900 Constipation, unspecified: Secondary | ICD-10-CM

## 2016-03-24 DIAGNOSIS — F17210 Nicotine dependence, cigarettes, uncomplicated: Secondary | ICD-10-CM | POA: Diagnosis present

## 2016-03-24 DIAGNOSIS — R300 Dysuria: Secondary | ICD-10-CM | POA: Diagnosis not present

## 2016-03-24 DIAGNOSIS — Z87440 Personal history of urinary (tract) infections: Secondary | ICD-10-CM | POA: Diagnosis not present

## 2016-03-24 DIAGNOSIS — B999 Unspecified infectious disease: Secondary | ICD-10-CM

## 2016-03-24 LAB — URINALYSIS, ROUTINE W REFLEX MICROSCOPIC
Bilirubin Urine: NEGATIVE
GLUCOSE, UA: NEGATIVE mg/dL
Hgb urine dipstick: NEGATIVE
KETONES UR: NEGATIVE mg/dL
NITRITE: NEGATIVE
PH: 6.5 (ref 5.0–8.0)
PROTEIN: NEGATIVE mg/dL
Specific Gravity, Urine: 1.023 (ref 1.005–1.030)

## 2016-03-24 LAB — COMPREHENSIVE METABOLIC PANEL
ALBUMIN: 4.8 g/dL (ref 3.5–5.0)
ALT: 21 U/L (ref 14–54)
AST: 28 U/L (ref 15–41)
Alkaline Phosphatase: 98 U/L (ref 38–126)
Anion gap: 11 (ref 5–15)
BUN: 19 mg/dL (ref 6–20)
CHLORIDE: 103 mmol/L (ref 101–111)
CO2: 22 mmol/L (ref 22–32)
CREATININE: 0.62 mg/dL (ref 0.44–1.00)
Calcium: 9 mg/dL (ref 8.9–10.3)
GFR calc Af Amer: >60 mL/min (ref >60)
GLUCOSE: 88 mg/dL (ref 65–99)
POTASSIUM: 3.3 mmol/L — AB (ref 3.5–5.1)
Sodium: 136 mmol/L (ref 135–145)
Total Bilirubin: 0.5 mg/dL (ref 0.3–1.2)
Total Protein: 7.9 g/dL (ref 6.5–8.1)

## 2016-03-24 LAB — CBC
HCT: 37.4 % (ref 36.0–46.0)
Hemoglobin: 12.8 g/dL (ref 12.0–15.0)
MCH: 29.2 pg (ref 26.0–34.0)
MCHC: 34.2 g/dL (ref 30.0–36.0)
MCV: 85.2 fL (ref 78.0–100.0)
PLATELETS: 414 10*3/uL — AB (ref 150–400)
RBC: 4.39 MIL/uL (ref 3.87–5.11)
RDW: 13.2 % (ref 11.5–15.5)
WBC: 15.6 10*3/uL — AB (ref 4.0–10.5)

## 2016-03-24 LAB — RAPID URINE DRUG SCREEN, HOSP PERFORMED
Amphetamines: POSITIVE — AB
BENZODIAZEPINES: POSITIVE — AB
Barbiturates: NOT DETECTED
COCAINE: NOT DETECTED
Opiates: NOT DETECTED
Tetrahydrocannabinol: NOT DETECTED

## 2016-03-24 LAB — URINE MICROSCOPIC-ADD ON

## 2016-03-24 LAB — ETHANOL: ALCOHOL ETHYL (B): 49 mg/dL — AB (ref <5)

## 2016-03-24 LAB — ACETAMINOPHEN LEVEL

## 2016-03-24 LAB — PREGNANCY, URINE: Preg Test, Ur: NEGATIVE

## 2016-03-24 LAB — SALICYLATE LEVEL: Salicylate Lvl: <4 mg/dL (ref 2.8–30.0)

## 2016-03-24 MED ORDER — IBUPROFEN 200 MG PO TABS: 600.0000 mg | ORAL_TABLET | Freq: Three times a day (TID) | ORAL | Status: DC | PRN

## 2016-03-24 MED ORDER — ALUM & MAG HYDROXIDE-SIMETH 200-200-20 MG/5ML PO SUSP: 30.0000 mL | ORAL | Status: DC | PRN

## 2016-03-24 MED ORDER — MAGNESIUM HYDROXIDE 400 MG/5ML PO SUSP: 30.0000 mL | Freq: Every day | ORAL | Status: DC | PRN

## 2016-03-24 MED ORDER — ACETAMINOPHEN 325 MG PO TABS
650.0000 mg | ORAL_TABLET | Freq: Four times a day (QID) | ORAL | Status: DC | PRN
  Administered 2016-03-25 (×2): 650 mg via ORAL
  Filled 2016-03-24 (×2): qty 2

## 2016-03-24 MED ORDER — ONDANSETRON HCL 4 MG PO TABS: 4.0000 mg | ORAL_TABLET | Freq: Three times a day (TID) | ORAL | Status: DC | PRN

## 2016-03-24 MED ORDER — QUETIAPINE FUMARATE 100 MG PO TABS: 200.0000 mg | ORAL_TABLET | Freq: Every day | ORAL | Status: DC

## 2016-03-24 MED ORDER — POTASSIUM CHLORIDE CRYS ER 20 MEQ PO TBCR
40.0000 meq | EXTENDED_RELEASE_TABLET | Freq: Once | ORAL | Status: AC
  Administered 2016-03-24: 40 meq via ORAL
  Filled 2016-03-24: qty 2

## 2016-03-24 MED ORDER — NICOTINE 14 MG/24HR TD PT24: 14.0000 mg | MEDICATED_PATCH | Freq: Every day | TRANSDERMAL | Status: DC

## 2016-03-24 MED ORDER — VENLAFAXINE HCL ER 75 MG PO CP24
75.0000 mg | ORAL_CAPSULE | Freq: Every day | ORAL | Status: DC
  Administered 2016-03-24: 75 mg via ORAL
  Filled 2016-03-24: qty 1

## 2016-03-24 MED ORDER — TRAZODONE HCL 50 MG PO TABS: 50.0000 mg | ORAL_TABLET | Freq: Every evening | ORAL | Status: DC | PRN

## 2016-03-24 MED ORDER — PRAZOSIN HCL 2 MG PO CAPS
2.0000 mg | ORAL_CAPSULE | Freq: Every day | ORAL | Status: DC
  Filled 2016-03-24: qty 1

## 2016-03-24 MED ORDER — LORAZEPAM 1 MG PO TABS
1.0000 mg | ORAL_TABLET | Freq: Once | ORAL | Status: AC
  Administered 2016-03-24: 1 mg via ORAL
  Filled 2016-03-24: qty 1

## 2016-03-24 MED ORDER — NICOTINE 21 MG/24HR TD PT24
21.0000 mg | MEDICATED_PATCH | Freq: Every day | TRANSDERMAL | Status: DC
  Administered 2016-03-27: 21 mg via TRANSDERMAL
  Filled 2016-03-24 (×6): qty 1

## 2016-03-24 MED ORDER — LORAZEPAM 1 MG PO TABS: 1.0000 mg | ORAL_TABLET | Freq: Three times a day (TID) | ORAL | Status: DC | PRN

## 2016-03-24 NOTE — BH Assessment (Signed)
Assessment completed. Consulted Maryjean Mornharles Kober, PA-C who recommended that pt be evaluated by the psychiatrist for final disposition.  Informed Dr. Bebe ShaggyWickline of the recommendation.

## 2016-03-24 NOTE — Progress Notes (Signed)
Disposition CSW completed patient referrals to the following inpatient [facility] facilities:  [facility]  CSW will continue to follow patient for placement needs.  Seward SpeckLeo Jordynne Mccown [facility] CenterCSW,LCAS Behavioral Health Disposition CSW [phone]

## 2016-03-24 NOTE — ED Notes (Signed)
Pt presents w/ her parents d/t SI.  Pt has a plan to hang herself.  Pt states she has not slept in 2 or three days.  Pt is displaying manic behavior.  Cannot sit still, crying uncontrollably, poor eye contact, pressured speech and fidgeting.  Per pt's mom pt told them tonight that she is going to kill herself, told them she wanted them to cremate her, told them what to do with her dog and attempted to jump out of a moving vehicle.  Pt also reported to this writer that once she hung herself she would like to be cremated.
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
## 2016-03-24 NOTE — ED Notes (Signed)
Pt has been in bed majority of this shift. Forwards little with this nurse, guarded. Compliant with prescribed medication regimen. Pt denies AVH, endorsing SI. Special checks q 15 mins in place for safety. Video monitoring in place.

## 2016-03-24 NOTE — ED Notes (Signed)
Patient noted in room. No complaints, stable, in no acute distress. Q15 minute rounds and monitoring via security cameras continue for safety. Pt denies SI, HI, A/ V H, depression, anxiety and pain at this time. Pt contracts for safety.

## 2016-03-24 NOTE — ED Notes (Signed)
EDP notified of urinalysis result. No new orders at this time.

## 2016-03-24 NOTE — [facility] Counselor (Signed)
[facility]; [facility]2 after 1900. Patient to be transported via [employer]. Patient's nurse Morrie Sheldon(Ashley) made aware.

## 2016-03-24 NOTE — ED Notes (Signed)
Patient admits to Pt has a plan to hang herself. Patient denies HI and AVH at this time. Plan of care discussed with patient. Patient voices no complaints or concerns at this time. Encouragement and support provided and safety maintain. Q 15 min safety checks in place.

## 2016-03-24 NOTE — ED Provider Notes (Signed)
Pt ia alert ambulates without diffculty . [facility] . Dr Marilynn LatinoKobos is accepting physician. Results for orders placed or performed during the hospital encounter of 03/23/16  Comprehensive metabolic panel  Result Value Ref Range   Sodium 136 135 - 145 mmol/L   Potassium 3.3 (L) 3.5 - 5.1 mmol/L   Chloride 103 101 - 111 mmol/L   CO2 22 22 - 32 mmol/L   Glucose, Bld 88 65 - 99 mg/dL   BUN 19 6 - 20 mg/dL   Creatinine, Ser 1.610.62 0.44 - 1.00 mg/dL   Calcium 9.0 8.9 - 09.610.3 mg/dL   Total Protein 7.9 6.5 - 8.1 g/dL   Albumin 4.8 3.5 - 5.0 g/dL   AST 28 15 - 41 U/L   ALT 21 14 - 54 U/L   Alkaline Phosphatase 98 38 - 126 U/L   Total Bilirubin 0.5 0.3 - 1.2 mg/dL   GFR calc non Af Amer >60 >60 mL/min   GFR calc Af Amer >60 >60 mL/min   Anion gap 11 5 - 15  Ethanol  Result Value Ref Range   Alcohol, Ethyl (B) 49 (H) <5 mg/dL  Salicylate level  Result Value Ref Range   Salicylate Lvl <4.0 2.8 - 30.0 mg/dL  Acetaminophen level  Result Value Ref Range   Acetaminophen (Tylenol), Serum <10 (L) 10 - 30 ug/mL  cbc  Result Value Ref Range   WBC 15.6 (H) 4.0 - 10.5 K/uL   RBC 4.39 3.87 - 5.11 MIL/uL   Hemoglobin 12.8 12.0 - 15.0 g/dL   HCT 04.537.4 40.936.0 - 81.146.0 %   MCV 85.2 78.0 - 100.0 fL   MCH 29.2 26.0 - 34.0 pg   MCHC 34.2 30.0 - 36.0 g/dL   RDW 91.413.2 78.211.5 - 95.615.5 %   Platelets 414 (H) 150 - 400 K/uL  Rapid urine drug screen (hospital performed)  Result Value Ref Range   Opiates NONE DETECTED NONE DETECTED   Cocaine NONE DETECTED NONE DETECTED   Benzodiazepines POSITIVE (A) NONE DETECTED   Amphetamines POSITIVE (A) NONE DETECTED   Tetrahydrocannabinol NONE DETECTED NONE DETECTED   Barbiturates NONE DETECTED NONE DETECTED  Pregnancy, urine  Result Value Ref Range   Preg Test, Ur NEGATIVE NEGATIVE  Urinalysis, Routine w reflex microscopic (not at [facility])  Result Value Ref Range   Color, Urine YELLOW YELLOW   APPearance CLOUDY (A) CLEAR   Specific Gravity, Urine 1.023 1.005 -  1.030   pH 6.5 5.0 - 8.0   Glucose, UA NEGATIVE NEGATIVE mg/dL   Hgb urine dipstick NEGATIVE NEGATIVE   Bilirubin Urine NEGATIVE NEGATIVE   Ketones, ur NEGATIVE NEGATIVE mg/dL   Protein, ur NEGATIVE NEGATIVE mg/dL   Nitrite NEGATIVE NEGATIVE   Leukocytes, UA TRACE (A) NEGATIVE  Urine microscopic-add on  Result Value Ref Range   Squamous Epithelial / LPF 0-5 (A) NONE SEEN   WBC, UA 0-5 0 - 5 WBC/hpf   RBC / HPF 0-5 0 - 5 RBC/hpf   Bacteria, UA RARE (A) NONE SEEN   Urine-Other MUCOUS PRESENT    No results found.   Doug SouSam Dawnielle Christiana, MD 03/25/16 [id number]

## 2016-03-24 NOTE — ED Notes (Addendum)
TTS is at bedside. Pt will go to 42 after assessment
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
## 2016-03-24 NOTE — Tx Team (Signed)
Initial Interdisciplinary Treatment Plan   PATIENT STRESSORS: Financial difficulties Health problems Loss of boyfriend Marital or family conflict   PATIENT STRENGTHS: Ability for insight Average or above average intelligence Capable of independent living General fund of knowledge Religious Affiliation Supportive family/friends Work skills   PROBLEM LIST: Problem List/Patient Goals Date to be addressed Date deferred Reason deferred Estimated date of resolution  "Want to try to get the most out of groups" 03/24/16                 Depression 03/24/16     Increased risk for suicide 03/24/16     Fibromylgia, htn, add, chronic constipation, add 03/24/16                        DISCHARGE CRITERIA:  Ability to meet basic life and health needs Adequate post-discharge living arrangements Improved stabilization in mood, thinking, and/or behavior Medical problems require only outpatient monitoring Motivation to continue treatment in a less acute level of care Need for constant or close observation no longer present Reduction of life-threatening or endangering symptoms to within safe limits Safe-care adequate arrangements made Verbal commitment to aftercare and medication compliance  PRELIMINARY DISCHARGE PLAN: Outpatient therapy Participate in family therapy Return to previous living arrangement  PATIENT/FAMIILY INVOLVEMENT: This treatment plan has been presented to and reviewed with the patient, Heather Preston, and/or family member.  The patient and family have been given the opportunity to ask questions and make suggestions.  Fransico MichaelBrooks, Heather Preston 03/24/2016, 10:14 PM
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
## 2016-03-25 ENCOUNTER — Inpatient Hospital Stay (HOSPITAL_COMMUNITY): Payer: BLUE CROSS/BLUE SHIELD

## 2016-03-25 ENCOUNTER — Encounter (HOSPITAL_COMMUNITY): Payer: Self-pay | Admitting: Emergency Medicine

## 2016-03-25 DIAGNOSIS — F3130 Bipolar disorder, current episode depressed, mild or moderate severity, unspecified: Secondary | ICD-10-CM

## 2016-03-25 DIAGNOSIS — N390 Urinary tract infection, site not specified: Secondary | ICD-10-CM

## 2016-03-25 DIAGNOSIS — R300 Dysuria: Secondary | ICD-10-CM

## 2016-03-25 LAB — CBC
HEMATOCRIT: 37.9 % (ref 36.0–46.0)
HEMOGLOBIN: 12.5 g/dL (ref 12.0–15.0)
MCH: 29.1 pg (ref 26.0–34.0)
MCHC: 33 g/dL (ref 30.0–36.0)
MCV: 88.1 fL (ref 78.0–100.0)
Platelets: 340 10*3/uL (ref 150–400)
RBC: 4.3 MIL/uL (ref 3.87–5.11)
RDW: 13.7 % (ref 11.5–15.5)
WBC: 8.2 10*3/uL (ref 4.0–10.5)

## 2016-03-25 LAB — URINE MICROSCOPIC-ADD ON

## 2016-03-25 LAB — URINALYSIS, ROUTINE W REFLEX MICROSCOPIC
Bilirubin Urine: NEGATIVE
Glucose, UA: NEGATIVE mg/dL
Hgb urine dipstick: NEGATIVE
Ketones, ur: NEGATIVE mg/dL
NITRITE: NEGATIVE
PH: 6.5 (ref 5.0–8.0)
Protein, ur: NEGATIVE mg/dL
SPECIFIC GRAVITY, URINE: 1.024 (ref 1.005–1.030)

## 2016-03-25 LAB — COMPREHENSIVE METABOLIC PANEL
ALBUMIN: 3.7 g/dL (ref 3.5–5.0)
ALK PHOS: 85 U/L (ref 38–126)
ALT: 18 U/L (ref 14–54)
ANION GAP: 4 — AB (ref 5–15)
AST: 19 U/L (ref 15–41)
BUN: 15 mg/dL (ref 6–20)
CALCIUM: 9 mg/dL (ref 8.9–10.3)
CO2: 24 mmol/L (ref 22–32)
Chloride: 108 mmol/L (ref 101–111)
Creatinine, Ser: 0.6 mg/dL (ref 0.44–1.00)
GFR calc non Af Amer: >60 mL/min (ref >60)
GLUCOSE: 101 mg/dL — AB (ref 65–99)
POTASSIUM: 4 mmol/L (ref 3.5–5.1)
SODIUM: 136 mmol/L (ref 135–145)
TOTAL PROTEIN: 7 g/dL (ref 6.5–8.1)
Total Bilirubin: 0.4 mg/dL (ref 0.3–1.2)

## 2016-03-25 LAB — LIPASE, BLOOD: Lipase: 28 U/L (ref 11–51)

## 2016-03-25 MED ORDER — CLONAZEPAM 0.5 MG PO TABS
0.5000 mg | ORAL_TABLET | Freq: Three times a day (TID) | ORAL | Status: DC
  Administered 2016-03-25 – 2016-03-26 (×3): 0.5 mg via ORAL
  Filled 2016-03-25 (×4): qty 1

## 2016-03-25 MED ORDER — GABAPENTIN 100 MG PO CAPS
200.0000 mg | ORAL_CAPSULE | Freq: Two times a day (BID) | ORAL | Status: DC
  Administered 2016-03-25 – 2016-03-27 (×4): 200 mg via ORAL
  Filled 2016-03-25 (×11): qty 2

## 2016-03-25 MED ORDER — LEVOFLOXACIN 500 MG PO TABS
500.0000 mg | ORAL_TABLET | Freq: Every day | ORAL | Status: DC
  Administered 2016-03-25 – 2016-03-27 (×3): 500 mg via ORAL
  Filled 2016-03-25 (×4): qty 1
  Filled 2016-03-25: qty 2
  Filled 2016-03-25 (×2): qty 1

## 2016-03-25 MED ORDER — POLYETHYLENE GLYCOL 3350 17 G PO PACK: 17.0000 g | PACK | Freq: Three times a day (TID) | ORAL | Status: AC

## 2016-03-25 MED ORDER — VENLAFAXINE HCL ER 75 MG PO CP24
75.0000 mg | ORAL_CAPSULE | Freq: Every day | ORAL | Status: DC
  Administered 2016-03-25 – 2016-03-27 (×3): 75 mg via ORAL
  Filled 2016-03-25 (×9): qty 1

## 2016-03-25 MED ORDER — FLUCONAZOLE 150 MG PO TABS
150.0000 mg | ORAL_TABLET | Freq: Once | ORAL | Status: AC
  Administered 2016-03-25: 150 mg via ORAL
  Filled 2016-03-25: qty 1

## 2016-03-25 MED ORDER — POTASSIUM CHLORIDE CRYS ER 10 MEQ PO TBCR
10.0000 meq | EXTENDED_RELEASE_TABLET | Freq: Two times a day (BID) | ORAL | Status: AC
  Administered 2016-03-25 – 2016-03-26 (×3): 10 meq via ORAL
  Filled 2016-03-25 (×4): qty 1

## 2016-03-25 NOTE — Progress Notes (Signed)
D: Patient is lying in bed.  She is complaining of severe abdominal pain.  Patient has a current UTI which she will receive an antibiotic today.  Patient has minimal interaction with staff and peers.  She is not attending groups.  She presents with somewhat irritable mood; flat and blunted affect.  She denies any self harm behaviors or thoughts; she denies HI/AVH.  Patient denies withdrawal symptoms. A: Continue to monitor medication management and MD orders.  Safety checks completed every 15 minutes per protocol.  Offer support and encouragement as needed. R: Patient is receptive to staff; her behavior is appropriate.
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
## 2016-03-25 NOTE — Progress Notes (Signed)
   Pt was pleasant and cooperative during the adm process. However, pt complained of abdominal pain, and was observed grimacing and holding her side. Pt informed the writer that she's had bowel obstruction and also believes she has a UTI. Stated that she'd only urinated once today, though she has been drinking "lots of water".  Pt informed Clinical research associatewriter that she takes 2 meds for chronic constipation, one being linzess but can't remember the name of the "maple tasting laxative". Pt states, "my parents over reacted". Pt stated that she and her mother have discussed her wanted to be cremated in the past. Stated she did nothing to cause this admission. Pt denies ever being suicidal.   Writer contacted extender and informed of pt's labs, and complaints. AM to evaluate in the morning.  Pt has no other questions or concerns.

## 2016-03-25 NOTE — ED Provider Notes (Signed)
CSN: [id number]     Arrival date & time 03/25/16  1817 History   First MD Initiated Contact with Patient 03/25/16 2053     Chief Complaint  Patient presents with  . Pelvic Pain     (Consider location/radiation/quality/duration/timing/severity/associated sxs/prior Treatment) HPI Comments: Patient sent from [facility] where she is being treated for suicidal ideation, with history of MDD, substance abuse, recurrent UTI, ureteral stenosis with multiple dilations, PTSD, ADD, with current complaint of worsening abdominal pain. She describes dysuria, avoiding PO fluids so as to urinate less and avoid the pain. No flank pain or fever. No vomiting. She complains of LUQ abdominal pain and constipation reporting last BM 6 days ago. She has a history of chronic constipation with colonoscopy in the past (2014) by Dr. Loreta Ave with polypectomy. She had a cholecystectomy in 2015, otherwise, no abdominal surgeries. She was seen by Dr. York Spaniel of [facility] this afternoon whose consultation was requested by psychiatry attending, Dr. Jama Flavors.   Patient is a 35 y.o. female presenting with pelvic pain. The history is provided by the patient and a caregiver. No language interpreter was used.  Pelvic Pain Associated symptoms include abdominal pain. Pertinent negatives include no chills, fever, myalgias, nausea or vomiting.    Past Medical History  Diagnosis Date  . Kidney infection   . UTI (lower urinary tract infection)   . ADD (attention deficit disorder)   . Depression   . Bipolar disorder (HCC)   . PTSD (post-traumatic stress disorder)   . Anemia   . Fibromyalgia   . Headache     migraines  . Shortness of breath dyspnea     related to pain  . Dysrhythmia     palpitations  . Anxiety     panic attacks  . Hypertension   . Family history of adverse reaction to anesthesia     nausea with mother   Past Surgical History  Procedure Laterality Date  . Urethra surgery    . Breast surgery      implants  .  Wisdom tooth extraction    . Dilitation & currettage/hystroscopy with versapoint resection N/A 07/31/2015    Procedure: DILATATION & CURETTAGE/HYSTEROSCOPY WITH VERSAPOINT RESECTION;  Surgeon: Genia Del, MD;  Location: [facility];  Service: Gynecology;  Laterality: N/A;   History reviewed. No pertinent family history. Social History  Substance Use Topics  . Smoking status: Current Every Day Smoker -- 1.00 packs/day for 18 years    Types: Cigarettes  . Smokeless tobacco: Never Used  . Alcohol Use: 0.0 oz/week    0 Standard drinks or equivalent per week     Comment: socially - rarely   OB History    No data available     Review of Systems  Constitutional: Negative for fever and chills.  Respiratory: Negative.   Cardiovascular: Negative.   Gastrointestinal: Positive for abdominal pain and constipation. Negative for nausea and vomiting.  Genitourinary: Positive for dysuria. Negative for flank pain, vaginal bleeding, vaginal discharge, vaginal pain and menstrual problem.  Musculoskeletal: Negative.  Negative for myalgias.  Neurological: Negative.       Allergies  Chlorhexidine gluconate and Lyrica  Home Medications   Prior to Admission medications   Medication Sig Start Date End Date Taking? Authorizing Provider  ibuprofen (ADVIL,MOTRIN) 800 MG tablet Take 800 mg by mouth every 8 (eight) hours as needed for moderate pain.  09/14/14  Yes Historical Provider, MD  amitriptyline (ELAVIL) 50 MG tablet Take 50 mg by mouth at bedtime.  Historical Provider, MD  amphetamine-dextroamphetamine (ADDERALL) 20 MG tablet Take 20 mg by mouth 2 (two) times daily.  10/02/14   Historical Provider, MD  clonazePAM (KLONOPIN) 1 MG tablet Take 0.5-1 mg by mouth 3 (three) times daily as needed for anxiety.  10/02/14   Historical Provider, MD  cyclobenzaprine (FLEXERIL) 5 MG tablet Take 5 mg by mouth 3 (three) times daily as needed for muscle spasms.    Historical Provider, MD  etonogestrel  (NEXPLANON) 68 MG IMPL implant 1 each by Subdermal route continuous.    Historical Provider, MD  gabapentin (NEURONTIN) 300 MG capsule Take 300 mg by mouth 2 (two) times daily as needed. Fibromyalgia flare up.    Historical Provider, MD  Linaclotide Karlene Einstein(LINZESS) 290 MCG CAPS capsule Take 290 mcg by mouth daily.    Historical Provider, MD  oxyCODONE-acetaminophen (PERCOCET) 7.5-325 MG tablet Take 1 tablet by mouth every 4 (four) hours as needed for severe pain. Patient not taking: Reported on 03/24/2016 07/31/15   Genia DelMarie-Lyne Lavoie, MD  prazosin (MINIPRESS) 2 MG capsule Take 6 mg by mouth at bedtime.    Historical Provider, MD  QUEtiapine (SEROQUEL) 100 MG tablet Take 100 mg by mouth at bedtime. 03/04/16   Historical Provider, MD  traMADol (ULTRAM) 50 MG tablet Take 50 mg by mouth every 6 (six) hours as needed for moderate pain.     Historical Provider, MD  venlafaxine XR (EFFEXOR-XR) 75 MG 24 hr capsule Take 75 mg by mouth daily with breakfast.    Historical Provider, MD   BP 129/109 mmHg  Pulse 95  Temp(Src) 97.8 F (36.6 C) (Oral)  Resp 18  Ht 5\' 3"  (1.6 m)  Wt 69.627 kg  BMI 27.20 kg/m2  SpO2 96%  LMP  Physical Exam  Constitutional: She is oriented to person, place, and time. She appears well-developed and well-nourished.  HENT:  Head: Normocephalic.  Neck: Normal range of motion. Neck supple.  Cardiovascular: Normal rate and regular rhythm.   Pulmonary/Chest: Effort normal and breath sounds normal.  Abdominal: Soft. Bowel sounds are normal. She exhibits no distension. There is tenderness (Diffuse abdominal tenderness. ). There is no rebound and no guarding.  Genitourinary:  Deferred. Patient declines pelvic exam.  Musculoskeletal: Normal range of motion.  Neurological: She is alert and oriented to person, place, and time.  Skin: Skin is warm and dry. No rash noted.  Psychiatric: She has a normal mood and affect.    ED Course  Procedures (including critical care time) Labs  Review Labs Reviewed  COMPREHENSIVE METABOLIC PANEL - Abnormal; Notable for the following:    Glucose, Bld 101 (*)    Anion gap 4 (*)    All other components within normal limits  URINALYSIS, ROUTINE W REFLEX MICROSCOPIC (NOT AT [facility]) - Abnormal; Notable for the following:    APPearance CLOUDY (*)    Leukocytes, UA SMALL (*)    All other components within normal limits  URINE MICROSCOPIC-ADD ON - Abnormal; Notable for the following:    Squamous Epithelial / LPF 0-5 (*)    Bacteria, UA FEW (*)    All other components within normal limits  URINE CULTURE  LIPASE, BLOOD  CBC  CBC    Imaging Review Dg Abd Acute W/chest  03/25/2016  CLINICAL DATA:  Abdominal pain for 1 week EXAM: DG ABDOMEN ACUTE W/ 1V CHEST COMPARISON:  None. FINDINGS: Cardiac shadow is stable. The lungs are well aerated bilaterally. No acute bony abnormality is seen. The colon is predominantly air filled  with evidence of fecal material although no obstructive changes are seen. No free air is noted. No bony abnormality is noted. Questionable nodular density is noted overlying the left upper abdomen on the upright film. IMPRESSION: Negative chest. Questionable nodular density in the left upper abdomen. This may be related to ingested material. Gas and fecal material throughout the colon without obstructive change. Electronically Signed   By: Alcide Clever M.D.   On: 03/25/2016 22:36   I have personally reviewed and evaluated these images and lab results as part of my medical decision-making.   EKG Interpretation None      MDM   Final diagnoses:  Infection    1. Constipation 2. Abdominal pain 3. UTI (already on Levaquin)  The patient declines pelvic examination. Discussed she had yeast present in urine but she declines further examination. She complains more of constipation and that she has been unable to take her Linzess per usual. Imaging shows a nonobstructive pattern with constipation. Will prescribe Miralax TID  dosing x 3 days.   Seen by Dr. York Spaniel this afternoon at [facility]. Per his consult note she was started on Levaquin based on UTI history and current symptoms, and an ultrasound was ordered but not performed as of this evaluation. Dr. York Spaniel was to follow the patient with a.m. labs which have been ordered.   Will provide Diflucan for finding of yeast on UA. She is felt stable for discharge back to [facility] for further and ongoing psychiatric treatment.    Elpidio Anis, PA-C 03/25/16 2328  Gerhard Munch, MD 03/29/16 [phone]

## 2016-03-25 NOTE — Progress Notes (Addendum)
D: Ultrasound to be completed tomorrow due to scheduling conflict.  Patient continues to complain of abdominal pain.  Hospitalist in to see patient and ordered ultrasound and urinalysis.  Patient will also start on an antibiotic.  She has been lying in bed majority of the day.  She did come up to get her klonopin and effexor.  She denies SI/HI/AVH. A: Continue to monitor medication management and MD order.  Safety checks completed every 15 minutes per protocol.  Offer support and encouragement as needed. R: Patient has minimal interaction with staff and peers; she is isolative to her room.

## 2016-03-25 NOTE — [facility] Group Notes (Signed)
[facility]   03/25/2016 10:30 AM  Participation Quality:  Invited. DID NOT ATTEND. Pt chose to rest in room.   Smart, Claudio Mondry LCSW
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
## 2016-03-25 NOTE — Consult Note (Signed)
Medical Consultation   Heather Preston  ZOX:[id number]  DOB: 1980/11/01  DOA: 03/24/2016  PCP: Charlette Caffey, MD (Confirm with patient/family/NH records and if not entered, this has to be entered at [facility] point of entry)  Outpatient Specialists: psychiatry  (Specify speciality and name if known)   Requesting physician: Psychiatry   Reason for consultation: Probable UTI   History of Present Illness: Heather Preston is an 35 y.o. female with PMH of Substance abuse, MDD, Bipolar Disorder, frequent UTIs is admitted by psychiatry service for Suicidal ideations. hospitalist is asked to evaluate for possible UTI -Pt reports chronic intermittent urinary tract infection. She has been seen by urology in the past for ureteral stenosis requiring multiple dilatations. She reports dysuria for few days associated with my left sided back pain and feels like similar to her previous urinary tract infection symptoms.  Denies fever, no chills, no night sweats. No chest pains, no SOB, no nausea, vomiting or diarrhea   (The initial 2-3 lines should be focused and good to copy and paste in the HPI section of the daily progress note).  (Please avoid self-populating past medical history here)  (For level 3, the HPI must include 4+ descriptors: Location, Quality, Severity, Duration, Timing, Context, modifying factors, associated signs/symptoms and/or status of 3+ chronic problems.)       Review of Systems:  ROS As per HPI otherwise 10 point review of systems negative.  Unacceptable ROS statements: "10 systems reviewed," "Extensive" (without elaboration).  Acceptable ROS statements: "All others negative," "All others reviewed and are negative," and "All others unremarkable," with at LEAST ONE ROS documented Can't double dip - if using for HPI can't use for ROS   Past Medical History: Past Medical History  Diagnosis Date  . Kidney infection   . UTI (lower urinary tract infection)   . ADD  (attention deficit disorder)   . Depression   . Bipolar disorder (HCC)   . PTSD (post-traumatic stress disorder)   . Anemia   . Fibromyalgia   . Headache     migraines  . Shortness of breath dyspnea     related to pain  . Dysrhythmia     palpitations  . Anxiety     panic attacks  . Hypertension   . Family history of adverse reaction to anesthesia     nausea with mother    Past Surgical History: Past Surgical History  Procedure Laterality Date  . Urethra surgery    . Breast surgery      implants  . Wisdom tooth extraction    . Dilitation & currettage/hystroscopy with versapoint resection N/A 07/31/2015    Procedure: DILATATION & CURETTAGE/HYSTEROSCOPY WITH VERSAPOINT RESECTION;  Surgeon: Genia Del, MD;  Location: [facility];  Service: Gynecology;  Laterality: N/A;     Allergies:   Allergies  Allergen Reactions  . Chlorhexidine Gluconate Hives  . Lyrica [Pregabalin] Other (See Comments)    Passed out     Social History:  reports that she has been smoking Cigarettes.  She has a 18 pack-year smoking history. She has never used smokeless tobacco. She reports that she drinks alcohol. She reports that she does not use illicit drugs.   Family History: History reviewed. No pertinent family history.  Unacceptable: Noncontributory, unremarkable, or negative. Acceptable: Family history reviewed and not pertinent (If you reviewed it)   Physical Exam: Filed Vitals:   03/24/16 2137 03/24/16 2138 03/25/16 8119  BP: 123/88 125/97 115/81  Pulse: 112 126 97  Temp: 97.7 F (36.5 C)  98.2 F (36.8 C)  TempSrc: Oral  Oral  Resp: 18  16  Height: 5\' 3"  (1.6 m)    Weight: 69.627 kg (153 lb 8 oz)      Constitutional: Appearance,  Alert and awake, oriented x3, not in any acute distress. Eyes: PERLA, EOMI, irises appear normal, anicteric sclera,  ENMT: external ears and nose appear normal, normal hearing or hard of hearing            Lips appears normal, oropharynx  mucosa, tongue, posterior pharynx appear normal  Neck: neck appears normal, no masses, normal ROM, no thyromegaly, no JVD  CVS: S1-S2 clear, no murmur rubs or gallops, no LE edema, normal pedal pulses  Respiratory:  clear to auscultation bilaterally, no wheezing, rales or rhonchi. Respiratory effort normal. No accessory muscle use.  Abdomen: soft nontender, nondistended, normal bowel sounds, no hepatosplenomegaly, no hernias  Musculoskeletal: : no cyanosis, clubbing or edema noted bilaterally                       Joint/bones/muscle exam, strength, contractures or atrophy Neuro: Cranial nerves II-XII intact, strength, sensation, reflexes Psych: judgement and insight appear normal, stable mood and affect, mental status Skin: no rashes or lesions or ulcers, no induration or nodules   (Anything < 9 systems with 2 bullets each down codes to level 1) (If patient refuses exam can't bill higher level) (Make sure to document decubitus ulcers present on admission -- if possible -- and whether patient has chronic indwelling catheter at time of admission)  Data reviewed:  I have personally reviewed following labs and imaging studies Labs:  CBC:  Recent Labs Lab 03/24/16 0022  WBC 15.6*  HGB 12.8  HCT 37.4  MCV 85.2  PLT 414*    Basic Metabolic Panel:  Recent Labs Lab 03/24/16 0022  NA 136  K 3.3*  CL 103  CO2 22  GLUCOSE 88  BUN 19  CREATININE 0.62  CALCIUM 9.0   GFR Estimated Creatinine Clearance: 91 mL/min (by C-G formula based on Cr of 0.62). Liver Function Tests:  Recent Labs Lab 03/24/16 0022  AST 28  ALT 21  ALKPHOS 98  BILITOT 0.5  PROT 7.9  ALBUMIN 4.8   No results for input(s): LIPASE, AMYLASE in the last 168 hours. No results for input(s): AMMONIA in the last 168 hours. Coagulation profile No results for input(s): INR, PROTIME in the last 168 hours.  Cardiac Enzymes: No results for input(s): CKTOTAL, CKMB, CKMBINDEX, TROPONINI in the last 168  hours. BNP: Invalid input(s): POCBNP CBG: No results for input(s): GLUCAP in the last 168 hours. D-Dimer No results for input(s): DDIMER in the last 72 hours. Hgb A1c No results for input(s): HGBA1C in the last 72 hours. Lipid Profile No results for input(s): CHOL, HDL, LDLCALC, TRIG, CHOLHDL, LDLDIRECT in the last 72 hours. Thyroid function studies No results for input(s): TSH, T4TOTAL, T3FREE, THYROIDAB in the last 72 hours.  Invalid input(s): FREET3 Anemia work up No results for input(s): VITAMINB12, FOLATE, FERRITIN, TIBC, IRON, RETICCTPCT in the last 72 hours. Urinalysis    Component Value Date/Time   COLORURINE YELLOW 03/24/2016 1548   APPEARANCEUR CLOUDY* 03/24/2016 1548   LABSPEC 1.023 03/24/2016 1548   PHURINE 6.5 03/24/2016 1548   GLUCOSEU NEGATIVE 03/24/2016 1548   HGBUR NEGATIVE 03/24/2016 1548   BILIRUBINUR NEGATIVE 03/24/2016 1548   KETONESUR NEGATIVE 03/24/2016 1548  PROTEINUR NEGATIVE 03/24/2016 1548   UROBILINOGEN 0.2 03/30/2014 1643   NITRITE NEGATIVE 03/24/2016 1548   LEUKOCYTESUR TRACE* 03/24/2016 1548     Microbiology No results found for this or any previous visit (from the past 240 hour(s)).     Inpatient Medications:   Scheduled Meds: . clonazePAM  0.5 mg Oral TID  . gabapentin  200 mg Oral BID  . nicotine  21 mg Transdermal Q0600  . potassium chloride  10 mEq Oral BID  . venlafaxine XR  75 mg Oral Q breakfast   Continuous Infusions:    Radiological Exams on Admission: No results found.  Impression/Recommendations Active Problems:   Bipolar I disorder, most recent episode depressed (HCC)  35 y.o. female with PMH of Substance abuse, MDD, Bipolar Disorder, frequent UTIs is admitted by psychiatry service for Suicidal ideations. hospitalist is asked to evaluate for possible UTI  Persistent dysuria. Recent UTI (3 weeks ago). Leukocytosis at 16k. ? Incompletely treated infection. But UA: unremarkable except trace leukocyte esterase -we  will empirically treat with levofloxacin (previous E coli->sensitive to quinolones), check renal US r/o focal infection. Recheck labs AM, check urine culture   Thank you for this consultation.  Our [facility] hospitalist team will follow the patient with you.   Time Spent: >45 minutes   Esperanza Sheets M.D. [facility] 03/25/2016, 2:50 PM

## 2016-03-25 NOTE — ED Notes (Signed)
Pt states that she has had worsening abdominal pain and has a hx of urinary tract issues. [facility]. Pt does not want more tests. [facility]. Alert and oriented.
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
## 2016-03-25 NOTE — [facility] Suicide Risk Assessment (Signed)
[facility] INPATIENT:  Family/Significant Other Suicide Prevention Education  Suicide Prevention Education:  Patient Refusal for Family/Significant Other Suicide Prevention Education: The patient Heather Preston has refused to provide written consent for family/significant other to be provided Family/Significant Other Suicide Prevention Education during admission and/or prior to discharge.  Physician notified.  SPE completed with pt, as pt refused to consent to family contact. SPI pamphlet provided to pt and pt was encouraged to share information with support network, ask questions, and talk about any concerns relating to SPE. Pt denies access to guns/firearms and verbalized understanding of information provided. Mobile Crisis information also provided to pt.   Smart, Donnald Tabar LCSW 03/25/2016, 4:12 PM

## 2016-03-25 NOTE — Discharge Instructions (Signed)
PATIENT TO BE TRANSFERRED BACK TO BEHAVIORAL HEALTH FOR ONGOING PSYCHIATRIC TREATMENT  Abdominal Pain, Adult Many things can cause abdominal pain. Usually, abdominal pain is not caused by a disease and will improve without treatment. It can often be observed and treated at home. Your health care provider will do a physical exam and possibly order blood tests and X-rays to help determine the seriousness of your pain. However, in many cases, more time must pass before a clear cause of the pain can be found. Before that point, your health care provider may not know if you need more testing or further treatment. HOME CARE INSTRUCTIONS Monitor your abdominal pain for any changes. The following actions may help to alleviate any discomfort you are experiencing:  Only take over-the-counter or prescription medicines as directed by your health care provider.  Do not take laxatives unless directed to do so by your health care provider.  Try a clear liquid diet (broth, tea, or water) as directed by your health care provider. Slowly move to a bland diet as tolerated. SEEK MEDICAL CARE IF:  You have unexplained abdominal pain.  You have abdominal pain associated with nausea or diarrhea.  You have pain when you urinate or have a bowel movement.  You experience abdominal pain that wakes you in the night.  You have abdominal pain that is worsened or improved by eating food.  You have abdominal pain that is worsened with eating fatty foods.  You have a fever. SEEK IMMEDIATE MEDICAL CARE IF:  Your pain does not go away within 2 hours.  You keep throwing up (vomiting).  Your pain is felt only in portions of the abdomen, such as the right side or the left lower portion of the abdomen.  You pass bloody or black tarry stools. MAKE SURE YOU:  Understand these instructions.  Will watch your condition.  Will get help right away if you are not doing well or get worse.   This information is not  intended to replace advice given to you by your health care provider. Make sure you discuss any questions you have with your health care provider.   Document Released: 07/17/2005 Document Revised: 06/28/2015 Document Reviewed: 06/16/2013 Elsevier Interactive Patient Education 2016 [employer]. Constipation, Adult Constipation is when a person has fewer than three bowel movements a week, has difficulty having a bowel movement, or has stools that are dry, hard, or larger than normal. As people grow older, constipation is more common. A low-fiber diet, not taking in enough fluids, and taking certain medicines may make constipation worse.  CAUSES   Certain medicines, such as antidepressants, pain medicine, iron supplements, antacids, and water pills.   Certain diseases, such as diabetes, irritable bowel syndrome (IBS), thyroid disease, or depression.   Not drinking enough water.   Not eating enough fiber-rich foods.   Stress or travel.   Lack of physical activity or exercise.   Ignoring the urge to have a bowel movement.   Using laxatives too much.  SIGNS AND SYMPTOMS   Having fewer than three bowel movements a week.   Straining to have a bowel movement.   Having stools that are hard, dry, or larger than normal.   Feeling full or bloated.   Pain in the lower abdomen.   Not feeling relief after having a bowel movement.  DIAGNOSIS  Your health care provider will take a medical history and perform a physical exam. Further testing may be done for severe constipation. Some tests may include:  A barium enema X-ray to examine your rectum, colon, and, sometimes, your small intestine.   A sigmoidoscopy to examine your lower colon.   A colonoscopy to examine your entire colon. TREATMENT  Treatment will depend on the severity of your constipation and what is causing it. Some dietary treatments include drinking more fluids and eating more fiber-rich foods. Lifestyle  treatments may include regular exercise. If these diet and lifestyle recommendations do not help, your health care provider may recommend taking over-the-counter laxative medicines to help you have bowel movements. Prescription medicines may be prescribed if over-the-counter medicines do not work.  HOME CARE INSTRUCTIONS   Eat foods that have a lot of fiber, such as fruits, vegetables, whole grains, and beans.  Limit foods high in fat and processed sugars, such as french fries, hamburgers, cookies, candies, and soda.   A fiber supplement may be added to your diet if you cannot get enough fiber from foods.   Drink enough fluids to keep your urine clear or pale yellow.   Exercise regularly or as directed by your health care provider.   Go to the restroom when you have the urge to go. Do not hold it.   Only take over-the-counter or prescription medicines as directed by your health care provider. Do not take other medicines for constipation without talking to your health care provider first.  SEEK IMMEDIATE MEDICAL CARE IF:   You have bright red blood in your stool.   Your constipation lasts for more than 4 days or gets worse.   You have abdominal or rectal pain.   You have thin, pencil-like stools.   You have unexplained weight loss. MAKE SURE YOU:   Understand these instructions.  Will watch your condition.  Will get help right away if you are not doing well or get worse.   This information is not intended to replace advice given to you by your health care provider. Make sure you discuss any questions you have with your health care provider.   Document Released: 07/05/2004 Document Revised: 10/28/2014 Document Reviewed: 07/19/2013 Elsevier Interactive Patient Education [employer]2016 Elsevier Inc.

## 2016-03-26 LAB — CBC
HEMATOCRIT: 37.7 % (ref 36.0–46.0)
HEMOGLOBIN: 12.3 g/dL (ref 12.0–15.0)
MCH: 28.9 pg (ref 26.0–34.0)
MCHC: 32.6 g/dL (ref 30.0–36.0)
MCV: 88.7 fL (ref 78.0–100.0)
Platelets: 356 10*3/uL (ref 150–400)
RBC: 4.25 MIL/uL (ref 3.87–5.11)
RDW: 13.7 % (ref 11.5–15.5)
WBC: 6.9 10*3/uL (ref 4.0–10.5)

## 2016-03-26 MED ORDER — LINACLOTIDE 290 MCG PO CAPS
290.0000 ug | ORAL_CAPSULE | Freq: Every day | ORAL | Status: DC
  Administered 2016-03-26 – 2016-03-27 (×2): 290 ug via ORAL
  Filled 2016-03-26 (×4): qty 1

## 2016-03-26 MED ORDER — POLYETHYLENE GLYCOL 3350 17 G PO PACK
17.0000 g | PACK | Freq: Two times a day (BID) | ORAL | Status: DC
  Filled 2016-03-26 (×6): qty 1

## 2016-03-26 MED ORDER — CLONAZEPAM 0.5 MG PO TABS
0.5000 mg | ORAL_TABLET | Freq: Two times a day (BID) | ORAL | Status: DC
  Administered 2016-03-27: 0.5 mg via ORAL
  Filled 2016-03-26: qty 1

## 2016-03-26 MED ORDER — MINERAL OIL RE ENEM
1.0000 | ENEMA | Freq: Once | RECTAL | Status: AC
  Administered 2016-03-26: 1 via RECTAL
  Filled 2016-03-26: qty 1

## 2016-03-26 NOTE — Progress Notes (Signed)
Recreation Therapy Notes  Animal-Assisted Activity (AAA) Program Checklist/Progress Notes Patient Eligibility Criteria Checklist & Daily Group note for Rec Tx Intervention  Date: 06.06.2017 Time: 2:45pm Location: [address] Dayroom    AAA/T Program Assumption of Risk Form signed by Patient/ or Parent Legal Guardian Yes  Patient is free of allergies or sever asthma Yes  Patient reports no fear of animals Yes  Patient reports no history of cruelty to animals Yes  Patient understands his/her participation is voluntary Yes  Behavioral Response: Did not attend.    Shonn Farruggia L Naylah Cork, LRT/CTRS        Orva Gwaltney L 03/26/2016 2:59 PM 

## 2016-03-26 NOTE — Progress Notes (Signed)
D: Patient has been lying in bed this am.  She refused all her medications.  Patient also is refusing the ultrasound that was ordered yesterday.  Patient has basically been nonverbal just stating "no" to all questions asked.  She has been unwilling to go to groups and is not participating in her treatment.  Patient is asleep; no complaints of pain.  She denies SI/HI/AVH. A: Continue to monitor medication management and MD orders.  Safety checks completed every 15 minutes per protocol.  Offer support and encouragement as needed. R: Patient is isolative to room; she is not interacting with peers or staff.

## 2016-03-26 NOTE — Progress Notes (Signed)
Adult Psychoeducational Group Note  Date:  03/26/2016 Time:  8:25 PM  Group Topic/Focus:  Wrap-Up Group:   The focus of this group is to help patients review their daily goal of treatment and discuss progress on daily workbooks.  Participation Level:  Did Not Attend  Pt did not attend wrap-up group. Pt was asleep.    Cleotilde NeerJasmine S Jamacia Jester 03/26/2016, 8:54 PM

## 2016-03-26 NOTE — Progress Notes (Signed)
Pt not seen, chart reviewed-labs/vitals and called an d[facility]w [facility] 34/F with Chronic constipation and recurrent UTIs, [facility] with mood swings and SI with plan. She complained of symptoms of UTI and abd pain, had KUB with stool in colon and was seen by Dr.Ulugbek in consult and started on Levaquin for possible UTI, he had ordered a Renal US, which was refused by pt and per RN, declines all meds including laxatives and laying in room. -We will FU on Urine Cx and Renal US if that is done and would recommend bowel regimen with miralax BID for Chronic constipation which is likely related to psychotropic meds, further recommendations as results become available.  Heather CovePreetha Chantry Headen, MD

## 2016-03-26 NOTE — Progress Notes (Signed)
D. Pt returned from ED approximately 1245 am, pt still endorsing some abdominal pain on arrival. Pt did inquire about bedtime medications and chose not to take PRN trazodone, pt did ask for seroquel and minipress but shortly thereafter said that it was too late to take them and to disregard the request. Pt asked for and received a snack and something to drink and then said that she was tired and wanted to go lie down. A. Support provided. R. Safety maintained.

## 2016-03-26 NOTE — Progress Notes (Addendum)
[facility] MD Progress Note  03/26/2016 4:34 PM Heather Preston  MRN:  [id number] Subjective:   Patient reports ongoing abdominal/pelvic discomfort- states this is not new for her, she has a chronic history of constipation and states she has not had BM in a few days. She also reports history of repeated UTIs. At this time reports her mood is better, she is feeling "OK", and her focus is on being discharged soon. Objective : I have discussed case with treatment team and have met with patient . As discussed with staff she has been isolating in room, with little milieu participation at this time. She reports this is due to her physical discomfort as above, states she feels more comfortable in bed . She does present with improved mood, affect and smiles often and appropriately today, hoping for discharge soon. Denies any suicidal ideations. As above, she reports history of  constipation, history of UTIs and abdominal discomfort related to constipation. We discussed, states that laxatives do not work well for her and she feels she will need an enema to appropriately address .  Patient reports fair appetite but has been drinking fluids , and vitals are stable. Of note, currently no fever, no chills, and does not appear acutely  ill or in distress  Denies medication effects.  Labs reviewed as below. Principal Problem: Bipolar I disorder, most recent episode depressed ([place]) Diagnosis:   Patient Active Problem List   Diagnosis Date Noted  . Bipolar I disorder, most recent episode depressed ([place]) [F31.30]   . Post traumatic stress disorder (PTSD) [F43.10] 03/24/2016  . Weakness [R53.1] 10/05/2014  . 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 Leg weakness, bilateral [R29.898] 10/05/2014  . Saddle anesthesia [R20.8] 10/05/2014  . MDD (major depressive disorder) ([place]) [F32.9] 04/01/2014  . Drug overdose, intentional ([place]) [T50.902A] 03/30/2014  . Bipolar I disorder, most recent episode mixed ([place])  [F31.60] 11/02/2013  . 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 Major depression, recurrent ([place]) [F33.9] 11/01/2013  . Overdose [T50.901A] 10/31/2013  . Depression, major ([place]) [F32.9] 10/31/2013  . Tobacco abuse [Z72.0] 10/31/2013  . Suicide attempt ([place]) [T14.91] 10/31/2013  . Abnormal EKG [R94.31] 10/31/2013  . Intentional drug overdose ([place]) [T50.902A] 10/31/2013   Total Time spent with patient: 20 minutes    Past Medical History:  Past Medical History  Diagnosis Date  . Kidney infection   . UTI (lower urinary tract infection)   . ADD (attention deficit disorder)   . Depression   . Bipolar disorder ([place])   . PTSD (post-traumatic stress disorder)   . Anemia   . Fibromyalgia   . Headache     migraines  . Shortness of breath dyspnea     related to pain  . Dysrhythmia     palpitations  . Anxiety     panic attacks  . Hypertension   . Family history of adverse reaction to anesthesia     nausea with mother    Past Surgical History  Procedure Laterality Date  . Urethra surgery    . Breast surgery      implants  . Wisdom tooth extraction    . Dilitation & currettage/hystroscopy with versapoint resection N/A 07/31/2015    Procedure: DILATATION & CURETTAGE/HYSTEROSCOPY WITH VERSAPOINT RESECTION;  Surgeon: Princess Bruins, MD;  Location: [facility];  Service: Gynecology;  Laterality: N/A;   Family History: History reviewed. No pertinent family history.  Social History:  History  Alcohol Use  . 0.0 oz/week  .  0 Standard drinks or equivalent per week    Comment: socially - rarely     History  Drug Use No    Social History   Social History  . Marital Status: Single    Spouse Name: N/A  . Number of Children: 0  . Years of Education: 12+   Social History Main Topics  . Smoking status: Current Every Day Smoker -- 1.00 packs/day for 18 years    Types: Cigarettes  . Smokeless tobacco: Never Used  . Alcohol Use: 0.0 oz/week    0  Standard drinks or equivalent per week     Comment: socially - rarely  . Drug Use: No  . Sexual Activity: Not Currently    Birth Control/ Protection: None   Other Topics Concern  . None   Social History Narrative   Patient lives alone    Two years of college   No children   Right handed   Single      Additional Social History:    Pain Medications: see pta Prescriptions: see pta Over the Counter: see pta History of alcohol / drug use?: No history of alcohol / drug abuse  Sleep: Fair  Appetite:  Fair  Current Medications: Current Facility-Administered Medications  Medication Dose Route Frequency Provider Last Rate Last Dose  . acetaminophen (TYLENOL) tablet 650 mg  650 mg Oral Q6H PRN Truman Hayward, FNP   650 mg at 03/25/16 1746  . alum & mag hydroxide-simeth (MAALOX/MYLANTA) 200-200-20 MG/5ML suspension 30 mL  30 mL Oral Q4H PRN Truman Hayward, FNP      . clonazePAM (KLONOPIN) tablet 0.5 mg  0.5 mg Oral TID Craige Cotta, MD   0.5 mg at 03/26/16 1301  . gabapentin (NEURONTIN) capsule 200 mg  200 mg Oral BID Craige Cotta, MD   200 mg at 03/26/16 1303  . levofloxacin (LEVAQUIN) tablet 500 mg  500 mg Oral Daily Esperanza Sheets, MD   500 mg at 03/26/16 1303  . linaclotide (LINZESS) capsule 290 mcg  290 mcg Oral Daily Sanjuana Kava, NP   290 mcg at 03/26/16 1416  . magnesium hydroxide (MILK OF MAGNESIA) suspension 30 mL  30 mL Oral Daily PRN Truman Hayward, FNP      . nicotine (NICODERM CQ - dosed in mg/24 hours) patch 21 mg  21 mg Transdermal Q0600 Truman Hayward, FNP   21 mg at 03/25/16 0848  . polyethylene glycol (MIRALAX / GLYCOLAX) packet 17 g  17 g Oral BID Zannie Cove, MD      . potassium chloride (K-DUR,KLOR-CON) CR tablet 10 mEq  10 mEq Oral BID Craige Cotta, MD   10 mEq at 03/26/16 1303  . traZODone (DESYREL) tablet 50 mg  50 mg Oral QHS PRN,MR X 1 Truman Hayward, FNP      . venlafaxine XR (EFFEXOR-XR) 24 hr capsule 75 mg  75 mg Oral Q breakfast  Craige Cotta, MD   75 mg at 03/26/16 1302    Lab Results:  Results for orders placed or performed during the hospital encounter of 03/24/16 (from the past 48 hour(s))  Urinalysis, Routine w reflex microscopic     Status: Abnormal   Collection Time: 03/25/16  9:02 PM  Result Value Ref Range   Color, Urine YELLOW YELLOW   APPearance CLOUDY (A) CLEAR   Specific Gravity, Urine 1.024 1.005 - 1.030   pH 6.5 5.0 - 8.0   Glucose, UA NEGATIVE NEGATIVE mg/dL  Hgb urine dipstick NEGATIVE NEGATIVE   Bilirubin Urine NEGATIVE NEGATIVE   Ketones, ur NEGATIVE NEGATIVE mg/dL   Protein, ur NEGATIVE NEGATIVE mg/dL   Nitrite NEGATIVE NEGATIVE   Leukocytes, UA SMALL (A) NEGATIVE  Urine microscopic-add on     Status: Abnormal   Collection Time: 03/25/16  9:02 PM  Result Value Ref Range   Squamous Epithelial / LPF 0-5 (A) NONE SEEN   WBC, UA TOO NUMEROUS TO COUNT 0 - 5 WBC/hpf   RBC / HPF 0-5 0 - 5 RBC/hpf   Bacteria, UA FEW (A) NONE SEEN   Urine-Other YEAST PRESENT     Comment: MUCOUS PRESENT  Lipase, blood     Status: None   Collection Time: 03/25/16  9:22 PM  Result Value Ref Range   Lipase 28 11 - 51 U/L  Comprehensive metabolic panel     Status: Abnormal   Collection Time: 03/25/16  9:22 PM  Result Value Ref Range   Sodium 136 135 - 145 mmol/L   Potassium 4.0 3.5 - 5.1 mmol/L    Comment: DELTA CHECK NOTED REPEATED TO VERIFY NO VISIBLE HEMOLYSIS    Chloride 108 101 - 111 mmol/L   CO2 24 22 - 32 mmol/L   Glucose, Bld 101 (H) 65 - 99 mg/dL   BUN 15 6 - 20 mg/dL   Creatinine, Ser 0.60 0.44 - 1.00 mg/dL   Calcium 9.0 8.9 - 10.3 mg/dL   Total Protein 7.0 6.5 - 8.1 g/dL   Albumin 3.7 3.5 - 5.0 g/dL   AST 19 15 - 41 U/L   ALT 18 14 - 54 U/L   Alkaline Phosphatase 85 38 - 126 U/L   Total Bilirubin 0.4 0.3 - 1.2 mg/dL   GFR calc non Af Amer >60 >60 mL/min   GFR calc Af Amer >60 >60 mL/min    Comment: (NOTE) The eGFR has been calculated using the CKD EPI equation. This calculation  has not been validated in all clinical situations. eGFR's persistently <60 mL/min signify possible Chronic Kidney Disease.    Anion gap 4 (L) 5 - 15  CBC     Status: None   Collection Time: 03/25/16  9:22 PM  Result Value Ref Range   WBC 8.2 4.0 - 10.5 K/uL   RBC 4.30 3.87 - 5.11 MIL/uL   Hemoglobin 12.5 12.0 - 15.0 g/dL   HCT 37.9 36.0 - 46.0 %   MCV 88.1 78.0 - 100.0 fL   MCH 29.1 26.0 - 34.0 pg   MCHC 33.0 30.0 - 36.0 g/dL   RDW 13.7 11.5 - 15.5 %   Platelets 340 150 - 400 K/uL  CBC     Status: None   Collection Time: 03/26/16  6:23 AM  Result Value Ref Range   WBC 6.9 4.0 - 10.5 K/uL   RBC 4.25 3.87 - 5.11 MIL/uL   Hemoglobin 12.3 12.0 - 15.0 g/dL   HCT 37.7 36.0 - 46.0 %   MCV 88.7 78.0 - 100.0 fL   MCH 28.9 26.0 - 34.0 pg   MCHC 32.6 30.0 - 36.0 g/dL   RDW 13.7 11.5 - 15.5 %   Platelets 356 150 - 400 K/uL    Comment: Performed at [facility]    Blood Alcohol level:  Lab Results  Component Value Date   [facility] 49* 03/24/2016   ETH <11 03/30/2014    Physical Findings: AIMS:  , ,  ,  ,    CIWA:  COWS:     Musculoskeletal: Strength & Muscle Tone: within normal limits Gait & Station: normal Patient leans: N/A  Psychiatric Specialty Exam: Physical Exam  ROS describes abdominal ,pelvic discomfort, describes constipation, at this time does not endorse dysuria, no fever, no chills   Blood pressure 120/80, pulse 85, temperature 98 F (36.7 C), temperature source Oral, resp. rate 16, height _0  (1.6 m), weight 153 lb 8 oz (69.627 kg), SpO2 97 %.Body mass index is 27.2 kg/(m^2).  General Appearance: Fairly Groomed  Eye Contact:  Good  Speech:  Normal Rate  Volume:  Normal  Mood:  improved mood and improved range of affect   Affect:  Appropriate  Thought Process:  Linear  Orientation:  Full (Time, Place, and Person)  Thought Content:  denies any hallucinations, no delusions, not internally preoccupied   Suicidal Thoughts:  No denies any  suicidal ideations, denies any self injurious ideations, no homicidal or violent ideations   Homicidal Thoughts:  No  Memory:  recent and remote grossly intact   Judgement:  Other:  improving   Insight:  improving   Psychomotor Activity:  Decreased  Concentration:  Concentration: Good and Attention Span: Good  Recall:  Good  Fund of Knowledge:  Good  Language:  Good  Akathisia:  Negative  Handed:  Right  AIMS (if indicated):     Assets:  Desire for Improvement Resilience  ADL's:  Intact  Cognition:  WNL  Sleep:  Number of Hours: 4.5   Assessment- patient presents with improving mood and range of affect at this time. She is somatically focused and reports discomfort related to constipation, which she states is a chronic issue for her, and also has a history of repeated UTIs. At this time vitals are stable, no fever, and not in any acute distress . As she feels better, she is hoping for discharge soon. Tolerating Effexor XR and Neurontin well at this time  Treatment Plan Summary: Daily contact with patient to assess and evaluate symptoms and progress in treatment, Medication management, Plan inpatient treatment  and medications as below Continue to encourage milieu , group participation to work on coping skills and symptom reduction  Continue Neurontin 200 mgrs BID for anxiety  Continue Effexor XR 75 mgrs QDAY for depression, anxiety Decrease Klonopin to 0.5 mgrs BID for anxiety  Currently on Levaquin for UTI management  Of note, patient has  refused Renal US recommended by Hospitalist, ED MD.  Consider enema to manage constipation, patient declines laxatives , states they do not work well for her  Treatment team working on disposition planning options  Neita Garnet, MD 03/26/2016, 4:34 PM

## 2016-03-26 NOTE — Progress Notes (Signed)
D: Pt has depressed affect and mood.  She was ni bed in her room upon initial approach.  Pt reports her goal is "just to feel better."  Pt reports she has had "trouble urinating."  She also reports constipation.  Pt reports she went to the ED because she felt she was retaining urine.  Pt denies SI/HI, denies hallucinations.  Pt has stayed in her room for the majority of the night.  She did not attend evening group.     A: Introduced self to pt.  Met with pt and offered support and encouragement.  PO fluids encouraged.  Pt encouraged to ambulate and attend groups.   R:  Pt verbally contracts for safety.  She reports she will inform staff of needs and concerns.  Will continue to monitor and assess.

## 2016-03-26 NOTE — [facility] Group Notes (Signed)
The focus of this group is to educate the patient on the purpose and policies of crisis stabilization and provide a format to answer questions about their admission.  The group details unit policies and expectations of patients while admitted.  Patient did not attend 0900 nurse education orientation group this morning.  Patient stayed in bed.   

## 2016-03-26 NOTE — [facility] Group Notes (Signed)
[facility] LCSW Group Therapy  03/26/2016 1:18 PM  Type of Therapy:  Group Therapy  Participation Level:  Did Not Attend-pt invited. Chose to remain in bed.  Summary of Progress/Problems: [facility] Speaker came to talk about his personal journey with substance abuse and addiction.   Smart, Tekila Caillouet LCSW 03/26/2016, 1:18 PM

## 2016-03-27 LAB — URINE CULTURE

## 2016-03-27 MED ORDER — TRAZODONE HCL 50 MG PO TABS: 50.0000 mg | ORAL_TABLET | Freq: Every evening | ORAL | Status: AC | PRN

## 2016-03-27 MED ORDER — HYDROXYZINE HCL 50 MG PO TABS
50.0000 mg | ORAL_TABLET | Freq: Once | ORAL | Status: AC
  Administered 2016-03-27: 50 mg via ORAL

## 2016-03-27 MED ORDER — NICOTINE 21 MG/24HR TD PT24: 21.0000 mg | MEDICATED_PATCH | Freq: Every day | TRANSDERMAL | Status: AC

## 2016-03-27 MED ORDER — VENLAFAXINE HCL ER 75 MG PO CP24: 75.0000 mg | ORAL_CAPSULE | Freq: Every day | ORAL | Status: AC

## 2016-03-27 MED ORDER — HYDROXYZINE HCL 50 MG PO TABS
ORAL_TABLET | ORAL | Status: AC
Start: 2016-03-27 — End: 2016-03-27
  Filled 2016-03-27: qty 1

## 2016-03-27 MED ORDER — CLONAZEPAM 0.5 MG PO TABS: 0.5000 mg | ORAL_TABLET | Freq: Two times a day (BID) | ORAL | Status: AC

## 2016-03-27 MED ORDER — LINACLOTIDE 290 MCG PO CAPS: 290.0000 ug | ORAL_CAPSULE | Freq: Every day | ORAL | Status: AC

## 2016-03-27 MED ORDER — GABAPENTIN 100 MG PO CAPS: 200.0000 mg | ORAL_CAPSULE | Freq: Two times a day (BID) | ORAL | Status: AC

## 2016-03-27 MED ORDER — LEVOFLOXACIN 500 MG PO TABS: 500.0000 mg | ORAL_TABLET | Freq: Every day | ORAL | Status: AC
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
## 2016-03-27 NOTE — [facility] Suicide Risk Assessment (Signed)
[facility] INPATIENT:  Family/Significant Other Suicide Prevention Education  Suicide Prevention Education:  Patient Refusal for Family/Significant Other Suicide Prevention Education: The patient Heather Preston has refused to provide written consent for family/significant other to be provided Family/Significant Other Suicide Prevention Education during admission and/or prior to discharge.  Physician notified.  SPE completed with pt, as pt refused to consent to family contact. SPI pamphlet provided to pt and pt was encouraged to share information with support network, ask questions, and talk about any concerns relating to SPE. Pt denies access to guns/firearms and verbalized understanding of information provided. Mobile Crisis information also provided to pt.   Smart, Heather Labarge LCSW 03/27/2016, 10:55 AM

## 2016-03-27 NOTE — Progress Notes (Signed)
Recreation Therapy Notes  Date: 06.07.2017 Time: 9:30am Location: [address] Group Room   Group Topic: Stress Management  Goal Area(s) Addresses:  Patient will actively participate in stress management techniques presented during session.   Behavioral Response:Did not attend.   Avelino Herren L Zaryah Seckel, LRT/CTRS        Rilyn Scroggs L 03/27/2016 10:26 AM 
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
## 2016-03-27 NOTE — [facility] Suicide Risk Assessment (Signed)
[facility] Discharge Suicide Risk Assessment   Principal Problem: Bipolar I disorder, most recent episode depressed [facility]) Discharge Diagnoses:  Patient Active Problem List   Diagnosis Date Noted  . Bipolar I disorder, most recent episode depressed (HCC) [F31.30]   . Post traumatic stress disorder (PTSD) [F43.10] 03/24/2016  . Weakness [R53.1] 10/05/2014  . 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 Leg weakness, bilateral [R29.898] 10/05/2014  . Saddle anesthesia [R20.8] 10/05/2014  . MDD (major depressive disorder) (HCC) [F32.9] 04/01/2014  . Drug overdose, intentional (HCC) [T50.902A] 03/30/2014  . Bipolar I disorder, most recent episode mixed (HCC) [F31.60] 11/02/2013  . 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 Major depression, recurrent (HCC) [F33.9] 11/01/2013  . Overdose [T50.901A] 10/31/2013  . Depression, major (HCC) [F32.9] 10/31/2013  . Tobacco abuse [Z72.0] 10/31/2013  . Suicide attempt (HCC) [T14.91] 10/31/2013  . Abnormal EKG [R94.31] 10/31/2013  . Intentional drug overdose (HCC) [T50.902A] 10/31/2013    Total Time spent with patient: 30 minutes  Musculoskeletal: Strength & Muscle Tone: within normal limits Gait & Station: normal Patient leans: N/A  Psychiatric Specialty Exam: ROS  Blood pressure 109/79, pulse 121, temperature 98 F (36.7 C), temperature source Oral, resp. rate 17, height  (1.6 m), weight 153 lb 8 oz (69.627 kg), SpO2 97 %.Body mass index is 27.2 kg/(m^2).  General Appearance: Fairly Groomed  Patent attorney::  Good  Speech:  Normal Rate409  Volume:  Normal  Mood:  improved, minimizes depression at this time  Affect:  Appropriate and smiles at times appropriately   Thought Process:  Linear  Orientation:  Full (Time, Place, and Person)  Thought Content:  no hallucinations, no delusions   Suicidal Thoughts:  No- denies any suicidal ideations, denies any self injurious ideations   Homicidal Thoughts:   No  Memory:  recent and remote grossly intact   Judgement:  Other:  improving   Insight:  improving   Psychomotor Activity:  Normal- tends to remain in bed, little milieu participation   Concentration:  Good  Recall:  Good  Fund of Knowledge:Good  Language: Good  Akathisia:  Negative  Handed:  Right  AIMS (if indicated):     Assets:  Desire for Improvement Resilience  Sleep:  Number of Hours: 5.75  Cognition: WNL  ADL's:  Intact   Mental Status Per Nursing Assessment::   On Admission:  NA  Demographic Factors:  35 year old female   Loss Factors: Relationship stressors   Historical Factors: Reports history of Bipolar Disorder and PTSD .   Risk Reduction Factors:   Resilience, coping skills   Continued Clinical Symptoms:  Patient reports she feels better, and states her mood is "OK". At this time presents alert, attentive, well related, good eye contact, speech normal, mood improved and affect more reactive, no thought disorder, no SI or HI, no psychotic symptoms, future oriented. Denies medication side effects. Describes ongoing abdominal discomfort related to chronic constipation, but states that she thinks this will resolve with the diet she follows at home, and use of Linzess .  At this time does not appear in distress, discomfort , or acutely ill  Cognitive Features That Contribute To Risk:  No gross cognitive deficits noted upon discharge. Is alert , attentive, and oriented x 3   Suicide Risk:  Mild   Follow-up Information    Follow up with [facility] On 04/17/2016.   Why:  Appt on this date at 3:30PM for medication management/counseling.  Contact information:   [address] [place], [place] [address] Phone: [phone] Fax: [phone]      Plan Of Care/Follow-up recommendations:  Activity:  as tolerated Diet:  regular  Tests:  NA Other:  see below  Patient is requesting discharge- at this time no grounds for involuntary commitment .  She  is leaving unit in good spirits  Plans to return home and follow up as above Has an established PCP for management of medical issues that she plans to follow up with - Dr. Willa FraterPrea Bithamy ( unsure of spelling )  Nehemiah MassedOBOS, FERNANDO, MD 03/27/2016, 11:26 AM

## 2016-03-27 NOTE — Progress Notes (Signed)
  [facility] Dis[facility] :  Will you be returning to the same living situation after discharge:  Yes,  home At discharge, do you have transportation home?: Yes,  family member Do you have the ability to pay for your medications: Yes,  [employer] private insurance  Release of information consent forms completed and submitted to medical records by CSW.  Patient to Follow up at: Follow-up Information    Follow up with [facility] On 04/17/2016.   Why:  Appt on this date at 3:30PM for medication management/counseling.    Contact information:   [address] [place], [place] [address] Phone: [phone] Fax: [phone]      Next level of care provider has access to [facility] Link:no  Safety Planning and Suicide Prevention discussed: Yes,  SPE completed with pt; pt declined to consent to family contact. SPI pamphlet and mobile crisis information provided to pt and she was encouraged to share information with support network, ask questions, and talk about any concerns relating to SPE.  Have you used any form of tobacco in the last 30 days? (Cigarettes, Smokeless Tobacco, Cigars, and/or Pipes): Yes  Has patient been referred to the Quitline?: Patient refused referral  Patient has been referred for addiction treatment: Yes  Smart, Sayuri Rhames LCSW 03/27/2016, 10:55 AM

## 2016-03-27 NOTE — Plan of Care (Signed)
Problem: Activity: Goal: Interest or engagement in activities will improve Outcome: Not Progressing Pt has remained in her room for the majority of the night.  Staff encouraged her to attend evening group but pt chose not to.

## 2016-04-09 ENCOUNTER — Emergency Department (HOSPITAL_COMMUNITY)
Admission: EM | Admit: 2016-04-09 | Discharge: 2016-04-10 | Disposition: A | Discharge status: Home / Self Care | Payer: BLUE CROSS/BLUE SHIELD | Attending: Emergency Medicine | Admitting: Emergency Medicine

## 2016-04-09 ENCOUNTER — Encounter (HOSPITAL_COMMUNITY): Payer: Self-pay | Admitting: *Deleted

## 2016-04-09 DIAGNOSIS — F4321 Adjustment disorder with depressed mood: Secondary | ICD-10-CM | POA: Insufficient documentation

## 2016-04-09 DIAGNOSIS — F17210 Nicotine dependence, cigarettes, uncomplicated: Secondary | ICD-10-CM | POA: Diagnosis not present

## 2016-04-09 DIAGNOSIS — R339 Retention of urine, unspecified: Secondary | ICD-10-CM | POA: Diagnosis not present

## 2016-04-09 DIAGNOSIS — F419 Anxiety disorder, unspecified: Secondary | ICD-10-CM

## 2016-04-09 DIAGNOSIS — Z79899 Other long term (current) drug therapy: Secondary | ICD-10-CM | POA: Insufficient documentation

## 2016-04-09 DIAGNOSIS — I10 Essential (primary) hypertension: Secondary | ICD-10-CM | POA: Insufficient documentation

## 2016-04-09 DIAGNOSIS — F319 Bipolar disorder, unspecified: Secondary | ICD-10-CM | POA: Diagnosis not present

## 2016-04-09 DIAGNOSIS — B379 Candidiasis, unspecified: Secondary | ICD-10-CM | POA: Insufficient documentation

## 2016-04-09 LAB — CBC
HEMATOCRIT: 33.5 % — AB (ref 36.0–46.0)
Hemoglobin: 11.4 g/dL — ABNORMAL LOW (ref 12.0–15.0)
MCH: 29.2 pg (ref 26.0–34.0)
MCHC: 34 g/dL (ref 30.0–36.0)
MCV: 85.9 fL (ref 78.0–100.0)
Platelets: 313 10*3/uL (ref 150–400)
RBC: 3.9 MIL/uL (ref 3.87–5.11)
RDW: 13.7 % (ref 11.5–15.5)
WBC: 6.6 10*3/uL (ref 4.0–10.5)

## 2016-04-09 LAB — COMPREHENSIVE METABOLIC PANEL
ALBUMIN: 3.7 g/dL (ref 3.5–5.0)
ALK PHOS: 68 U/L (ref 38–126)
ALT: 15 U/L (ref 14–54)
AST: 20 U/L (ref 15–41)
Anion gap: 7 (ref 5–15)
BILIRUBIN TOTAL: 0.5 mg/dL (ref 0.3–1.2)
BUN: 17 mg/dL (ref 6–20)
CO2: 24 mmol/L (ref 22–32)
CREATININE: 0.7 mg/dL (ref 0.44–1.00)
Calcium: 8.6 mg/dL — ABNORMAL LOW (ref 8.9–10.3)
Chloride: 109 mmol/L (ref 101–111)
GFR calc Af Amer: >60 mL/min (ref >60)
GLUCOSE: 102 mg/dL — AB (ref 65–99)
POTASSIUM: 3.1 mmol/L — AB (ref 3.5–5.1)
Sodium: 140 mmol/L (ref 135–145)
TOTAL PROTEIN: 6.2 g/dL — AB (ref 6.5–8.1)

## 2016-04-09 LAB — ETHANOL: Alcohol, Ethyl (B): <5 mg/dL (ref <5)

## 2016-04-09 LAB — ACETAMINOPHEN LEVEL

## 2016-04-09 LAB — SALICYLATE LEVEL: Salicylate Lvl: <4 mg/dL (ref 2.8–30.0)

## 2016-04-09 MED ORDER — POTASSIUM CHLORIDE CRYS ER 20 MEQ PO TBCR
40.0000 meq | EXTENDED_RELEASE_TABLET | Freq: Once | ORAL | Status: AC
  Administered 2016-04-09: 40 meq via ORAL
  Filled 2016-04-09: qty 2

## 2016-04-09 NOTE — ED Notes (Signed)
Pt was talking with her mother on the phone.
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
## 2016-04-09 NOTE — ED Notes (Signed)
Patient asking for [facility] RN aware
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
## 2016-04-09 NOTE — ED Notes (Signed)
Pt A&O x 3 at present, no distress noted,calm & cooperative, monitoring for safety, Q 15 min checks in effect.  Pending urine sample.
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
## 2016-04-09 NOTE — ED Notes (Signed)
Pt reports bila foot pain. -  Pt also reports that she see's her psychiatrist Qmonthly.

## 2016-04-09 NOTE — ED Notes (Signed)
Pt given water pitcher per request as she is unable to provide a urine specimen at this time. Pt is aware that it is needed. Pt is pleasant and appears calm.
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
## 2016-04-09 NOTE — ED Notes (Signed)
Pt's mother reports that pt have been manic, last night she insisted on living in the woods with card board box and they were not able to find her.  She states this is the reason why they paid for 2 nights at the [facility].  She also showed this nurse what pt texted her sister last night.  States that since they are not "there for me, you will not have a living sister tomorrow."

## 2016-04-10 DIAGNOSIS — F319 Bipolar disorder, unspecified: Secondary | ICD-10-CM | POA: Diagnosis not present

## 2016-04-10 LAB — URINALYSIS, ROUTINE W REFLEX MICROSCOPIC
Glucose, UA: NEGATIVE mg/dL
Ketones, ur: NEGATIVE mg/dL
Nitrite: NEGATIVE
Protein, ur: NEGATIVE mg/dL
Specific Gravity, Urine: 1.024 (ref 1.005–1.030)
pH: 6 (ref 5.0–8.0)

## 2016-04-10 LAB — URINE MICROSCOPIC-ADD ON

## 2016-04-10 LAB — RAPID URINE DRUG SCREEN, HOSP PERFORMED
AMPHETAMINES: POSITIVE — AB
Barbiturates: NOT DETECTED
Benzodiazepines: POSITIVE — AB
Cocaine: NOT DETECTED
OPIATES: NOT DETECTED
TETRAHYDROCANNABINOL: NOT DETECTED

## 2016-04-10 MED ORDER — FLUCONAZOLE 150 MG PO TABS
150.0000 mg | ORAL_TABLET | Freq: Once | ORAL | Status: AC
  Administered 2016-04-10: 150 mg via ORAL
  Filled 2016-04-10: qty 1

## 2016-04-10 MED ORDER — LORAZEPAM 1 MG PO TABS
1.0000 mg | ORAL_TABLET | Freq: Once | ORAL | Status: AC
  Administered 2016-04-10: 1 mg via ORAL
  Filled 2016-04-10: qty 1
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
## 2016-04-10 NOTE — ED Notes (Signed)
Pt c/o unable to urinate. Dr Victorino DecemberA and Jameson NP notified. Informed Pt is psychiatrically cleared. EDP and ED charge Misty StanleyStacey, RN notified.

## 2016-04-10 NOTE — ED Notes (Signed)
Pt transferred to room #29 per Jorene MinorsJameson, NP d/t pt c/o unable to urinate. Pt signed for personal belongings and personal belongings returned. Pt signed for medication from pharmacy vault. Pt denies SI/HI. Denies AVH. Pt ambulatory off unit with nurse tech to room #29.

## 2016-04-10 NOTE — ED Notes (Signed)
Called patient's sister and left message per patient request. Pt crying, moaning, whaling loudly. When asked what is bother her she responds that she needs a cell [employer] account plannerphone charger.

## 2016-04-10 NOTE — Discharge Instructions (Addendum)
Please call your urologist tomorrow to set up follow-up appointment for trial of urination. Return for worsening symptoms, including worsening pain, fever, or any other symptoms concerning to you.  For your ongoing behavioral health needs, you are advised to follow up with the [facility], your current outpatient provider:       [facility]      [address]      , [place] [address]      [phone]    Acute Urinary Retention, Female Acute urinary retention is the temporary inability to urinate. This is an uncommon problem in women. It can be caused by:  Infection.  A side effect of a medicine.  A problem in a nearby organ that presses or squeezes on the bladder or the urethra (the tube that drains the bladder).  Psychological problems.   Surgery on your bladder, urethra, or pelvic organs that causes obstruction to the outflow of urine from your bladder. HOME CARE INSTRUCTIONS  If you are sent home with a Foley catheter and a drainage system, you will need to discuss the best course of action with your health care provider. While the catheter is in, maintain a good intake of fluids. Keep the drainage bag emptied and lower than your catheter. This is so that contaminated urine will not flow back into your bladder, which could lead to a urinary tract infection. There are two main types of drainage bags. One is a large bag that usually is used at night. It has a good capacity that will allow you to sleep through the night without having to empty it. The second type is called a leg bag. It has a smaller capacity so it needs to be emptied more frequently. However, the main advantage is that it can be attached by a leg strap and goes underneath your clothing, allowing you the freedom to move about or leave your home. Only take over-the-counter or prescription medicines for pain, discomfort, or fever as directed by your health care provider.  SEEK MEDICAL CARE  IF:  You develop a low-grade fever.  You experience spasms or leakage of urine with the spasms. SEEK IMMEDIATE MEDICAL CARE IF:   You develop chills or fever.  Your catheter stops draining urine.  Your catheter falls out.  You start to develop increased bleeding that does not respond to rest and increased fluid intake. MAKE SURE YOU:  Understand these instructions.  Will watch your condition.  Will get help right away if you are not doing well or get worse.   This information is not intended to replace advice given to you by your health care provider. Make sure you discuss any questions you have with your health care provider.   Document Released: 10/06/2006 Document Revised: 02/21/2015 Document Reviewed: 03/18/2013 Elsevier Interactive Patient Education [employer]2016 Elsevier Inc.

## 2016-04-10 NOTE — ED Notes (Signed)
Bed: WA29 Expected date:  Expected time:  Means of arrival:  Comments: SAPPU cath

## 2016-04-10 NOTE — ED Notes (Signed)
Bladder scan completed per EDP request .

## 2016-04-10 NOTE — BH Assessment (Signed)
[facility] Assessment Progress Note  Per Thedore MinsMojeed Akintayo, MD, this pt does not require psychiatric hospitalization at this time. Pt presents under IVC initiated by Viann Fishavid Annon, her father. IVC is to be rescinded and [facility] with recommendation to follow up with [facility], her current outpatient provider. IVC has been rescinded. Discharge instructions include referral information for for the [facility]. Pt's nurse, Morrie Sheldonshley, has been notified.   Heather Canninghomas Lamarco Gudiel, MA  Triage Specialist  [phone]

## 2016-04-11 LAB — URINE CULTURE: Culture: >=100000 — AB
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
## 2016-04-11 NOTE — ED Provider Notes (Signed)
Seen by psychiatry and felt to be safe for discharge and continued outpatient psychiatric management. Prior to leaving, states that she has been unable to urinate. States that this is a longstanding issue since childhood. [facility] States she has had frequent foley catheter and in the process of getting set up by urology for self catheterization. Bedside bladder scan > 1L of urine. Discussed with patient options of management. Agreed on foley catheter placement, and [facility] one week for trial of urination and self cath teaching/tools. UA with yeast and given dose of diflucan.  Lavera Guiseana Duo Kaytlin Burklow, MD 04/11/16 [phone]

## 2016-04-12 NOTE — Telephone Encounter (Signed)
[facility] to [facility] a STAT referral for this pt.  The [place] urinary retention and had a cath placed 6/20.  Pt was at the urgent care for bladder pain and issues w/ the cath that was placed.  Pt has been seeing [facility] in [place] and has relocated to [place] this week.  I spoke w/ the pt while [facility] was [facility] and advised her that we MUST have the records from the ER and urologist prior to her appt.  I have scheduled an appt w/ [name] 6/27 arrival time 9am.  Advised the staff at [facility] that the pt needs to sign a medical release form for both the locations in [place] so that we receive the records prior to her appt.  If the records are not received I advised the pt and staff that she may not be able to be seen or she may have to wait in the waiting room for those records to arrive.  Just wanted to make sure that this information was given to [name].  Thank you.

## 2016-04-16 ENCOUNTER — Encounter: Payer: BLUE CROSS/BLUE SHIELD | Attending: Registered Nurse

## 2016-04-16 NOTE — Telephone Encounter (Signed)
Records have been received and sent up to medical records to scan.

## 2016-04-17 ENCOUNTER — Encounter: Payer: BLUE CROSS/BLUE SHIELD | Attending: Registered Nurse

## 2016-04-17 ENCOUNTER — Encounter: Attending: Registered Nurse

## 2016-04-17 ENCOUNTER — Ambulatory Visit: Admit: 2016-04-17 | Discharge: 2016-04-17 | Payer: BLUE CROSS/BLUE SHIELD | Attending: Registered Nurse

## 2016-04-17 DIAGNOSIS — R339 Retention of urine, unspecified: Secondary | ICD-10-CM

## 2016-04-17 MED ORDER — PHENAZOPYRIDINE 200 MG TAB
200 mg | ORAL_TABLET | Freq: Three times a day (TID) | ORAL | 1 refills | Status: AC | PRN
Start: 2016-04-17 — End: ?

## 2016-04-17 MED ORDER — PHENAZOPYRIDINE 200 MG TAB
200 mg | ORAL_TABLET | Freq: Three times a day (TID) | ORAL | 1 refills | Status: DC | PRN
Start: 2016-04-17 — End: 2016-04-17

## 2016-04-17 NOTE — Telephone Encounter (Signed)
Pyridium sent.
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
## 2016-04-17 NOTE — Telephone Encounter (Signed)
Pt calling and she was just her We had her script sent to [facility] in [place] but they do not take her insurance so she need [facility] [place]. Please.  She states [facility] has asked that we call them. Thank you

## 2016-05-02 ENCOUNTER — Encounter: Payer: BLUE CROSS/BLUE SHIELD | Attending: Registered Nurse

## 2017-09-19 IMAGING — CR DG ABDOMEN ACUTE W/ 1V CHEST
3 series · 3 of 3 positions shown · non-contrast
Comparison: None.

CLINICAL DATA: Abdominal pain for 1 week

EXAM:
DG ABDOMEN ACUTE W/ 1V CHEST

[w chest pa]
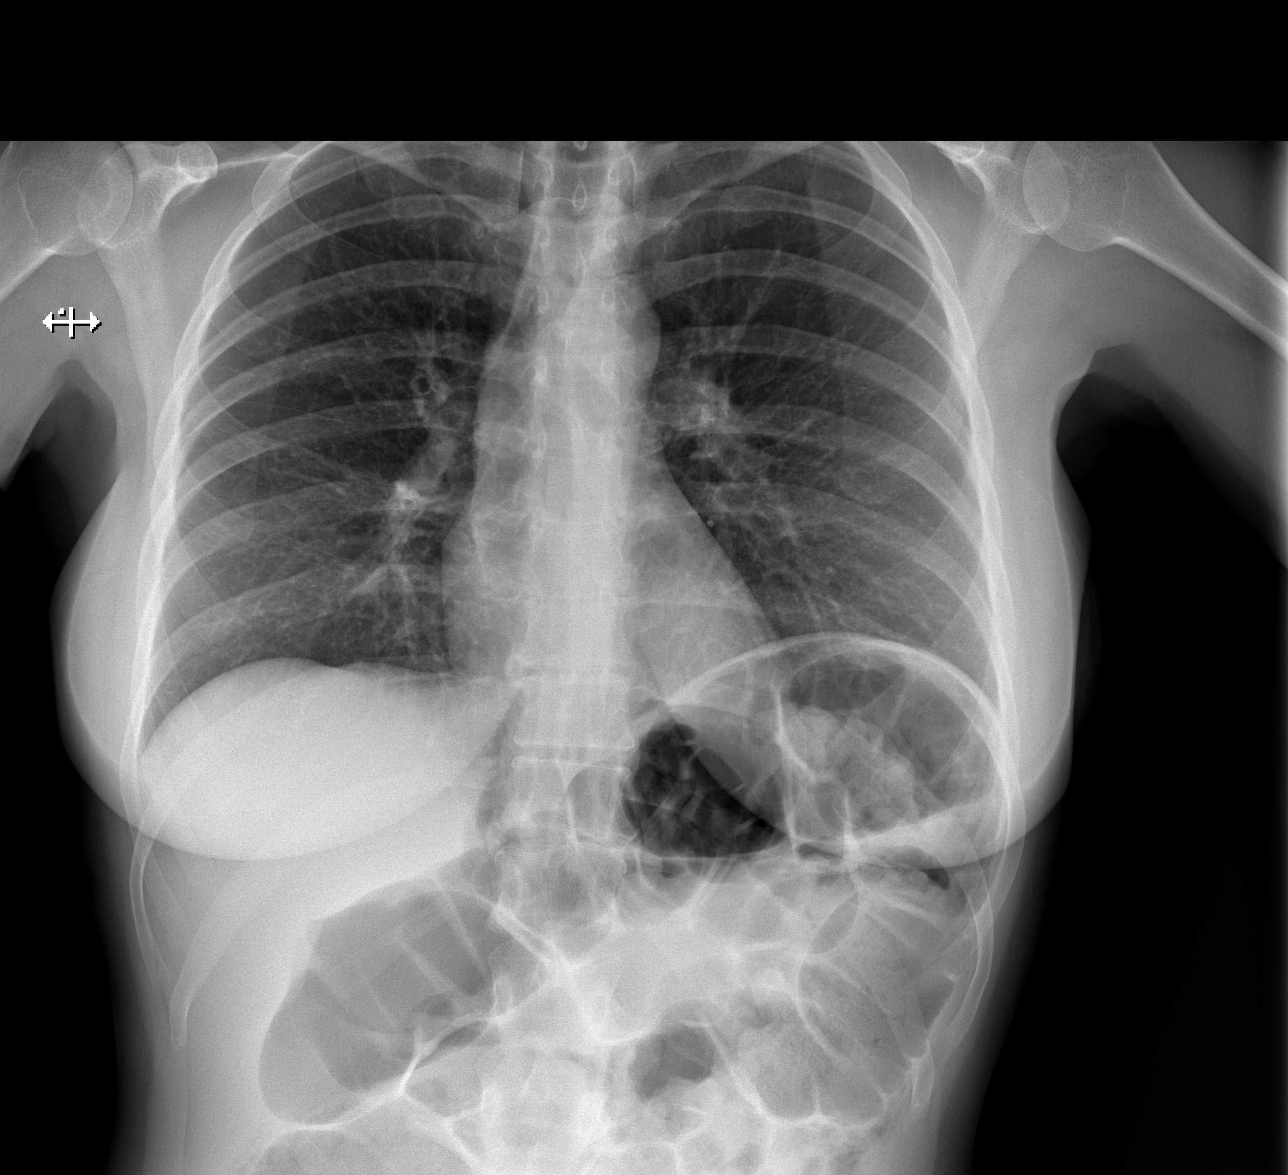

[w abdomen upright]
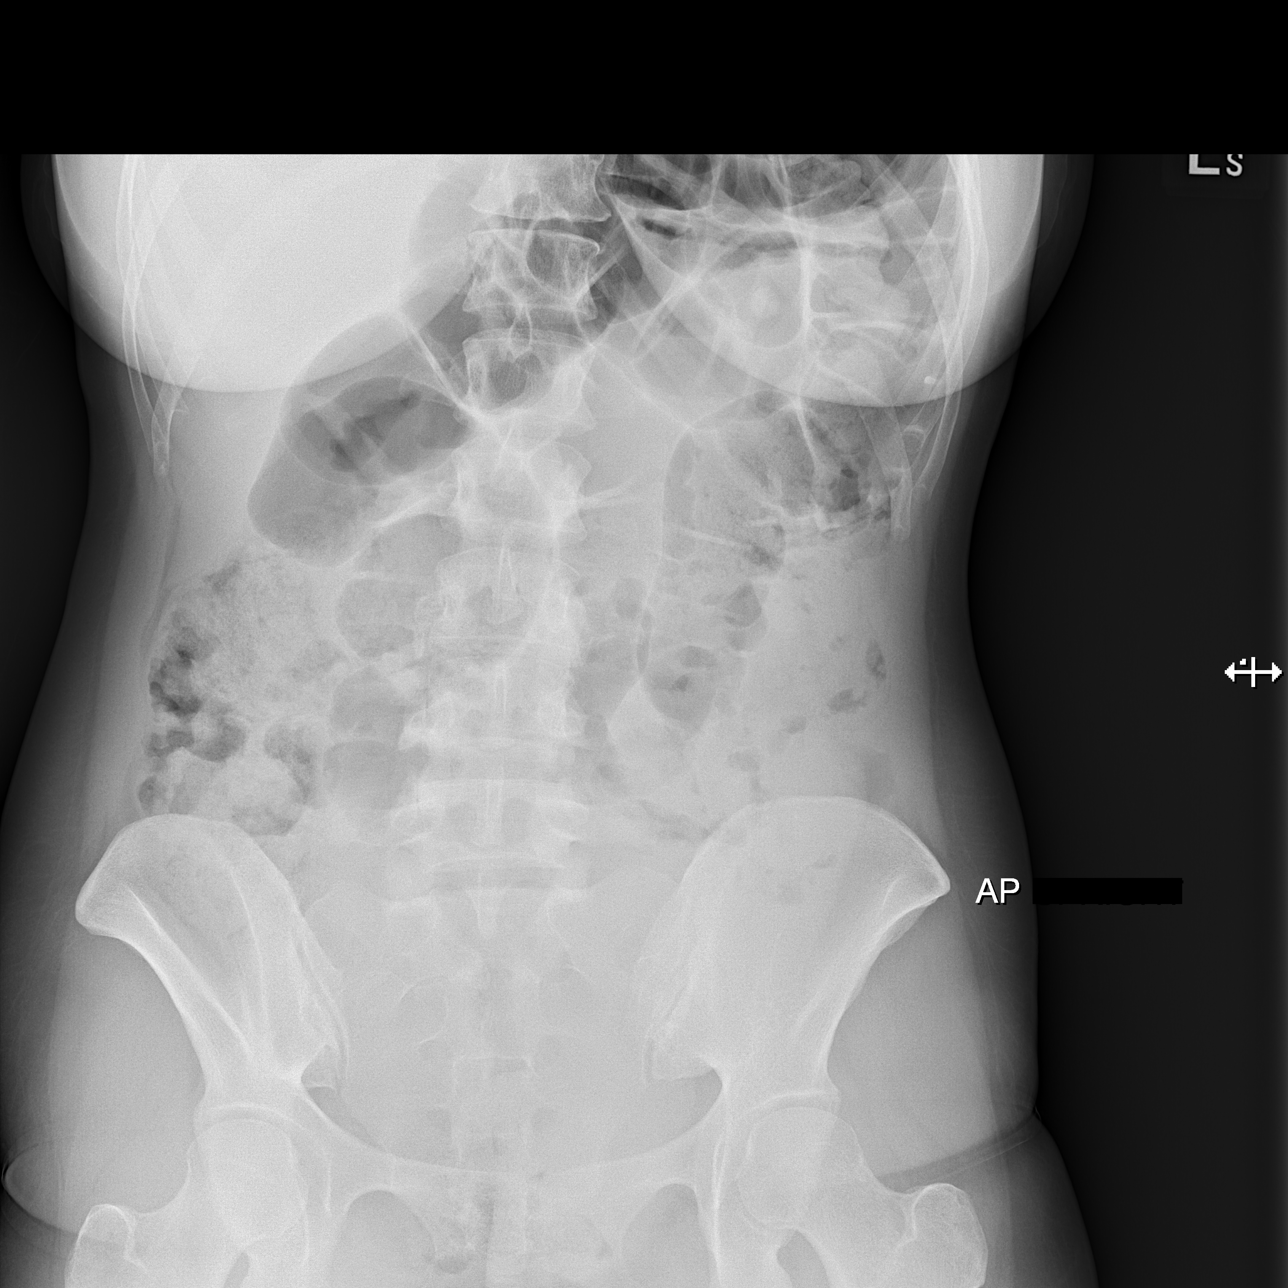

[t abdomen supine]
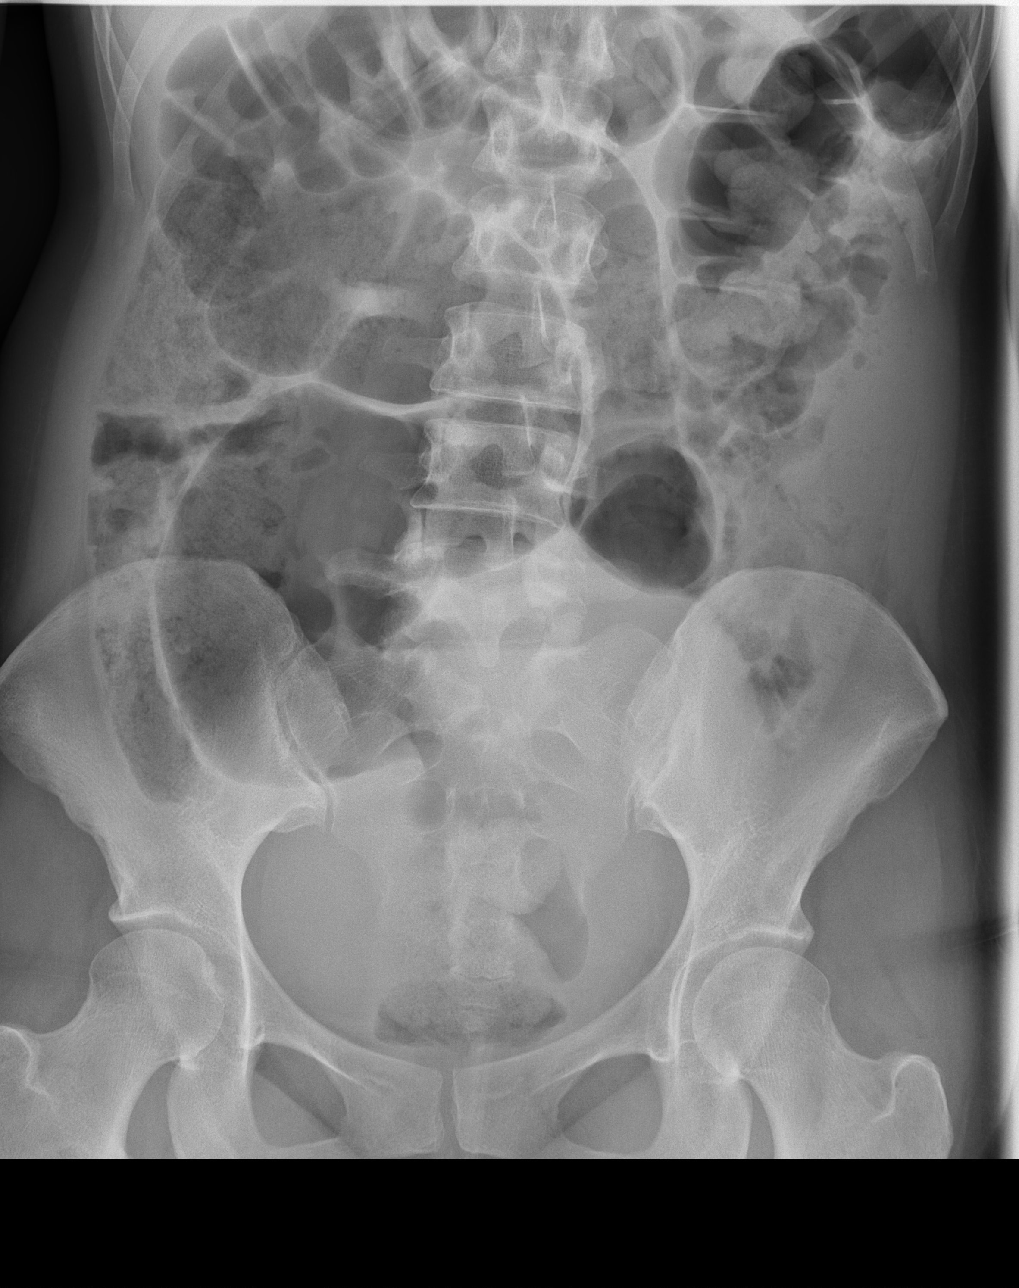

[3 of 3 positions shown; findings below may reference images not displayed]

FINDINGS: Cardiac shadow is stable. The lungs are well aerated bilaterally. No
acute bony abnormality is seen.

The colon is predominantly air filled with evidence of fecal
material although no obstructive changes are seen. No free air is
noted. No bony abnormality is noted. Questionable nodular density is
noted overlying the left upper abdomen on the upright film.
IMPRESSION: Negative chest.

Questionable nodular density in the left upper abdomen. This may be
related to ingested material.

Gas and fecal material throughout the colon without obstructive
change.
